# Patient Record
Sex: Male | Born: 1985 | Race: White | Hispanic: No | Marital: Married | Smoking: Current every day smoker
Health system: Southern US, Community
[De-identification: ages and names within clinical notes are randomized; demographics above are authoritative.]

## PROBLEM LIST (undated history)

## (undated) DIAGNOSIS — IMO0002 Reserved for concepts with insufficient information to code with codable children: Secondary | ICD-10-CM

## (undated) DIAGNOSIS — M109 Gout, unspecified: Secondary | ICD-10-CM

## (undated) DIAGNOSIS — F3110 Bipolar disorder, current episode manic without psychotic features, unspecified: Secondary | ICD-10-CM

## (undated) HISTORY — PX: VASECTOMY: SHX75

---

## 2013-10-04 ENCOUNTER — Encounter (HOSPITAL_COMMUNITY): Payer: Self-pay | Admitting: Emergency Medicine

## 2013-10-04 ENCOUNTER — Emergency Department (INDEPENDENT_AMBULATORY_CARE_PROVIDER_SITE_OTHER)
Admission: EM | Admit: 2013-10-04 | Discharge: 2013-10-04 | Disposition: A | Discharge status: Home / Self Care | Payer: Self-pay | Source: Home / Self Care

## 2013-10-04 DIAGNOSIS — A084 Viral intestinal infection, unspecified: Secondary | ICD-10-CM

## 2013-10-04 DIAGNOSIS — R6889 Other general symptoms and signs: Secondary | ICD-10-CM

## 2013-10-04 DIAGNOSIS — A088 Other specified intestinal infections: Secondary | ICD-10-CM

## 2013-10-04 DIAGNOSIS — J029 Acute pharyngitis, unspecified: Secondary | ICD-10-CM

## 2013-10-04 LAB — POCT RAPID STREP A: Streptococcus, Group A Screen (Direct): NEGATIVE

## 2013-10-04 MED ORDER — HYDROCOD POLST-CHLORPHEN POLST 10-8 MG/5ML PO LQCR: ORAL | Status: DC

## 2013-10-04 MED ORDER — CLINDAMYCIN HCL 300 MG PO CAPS: 300.0000 mg | ORAL_CAPSULE | Freq: Three times a day (TID) | ORAL | Status: DC

## 2013-10-04 MED ORDER — ONDANSETRON HCL 4 MG PO TABS: 4.0000 mg | ORAL_TABLET | Freq: Four times a day (QID) | ORAL | Status: DC

## 2013-10-04 NOTE — Discharge Instructions (Signed)
Viral Gastroenteritis °Viral gastroenteritis is also known as stomach flu. This condition affects the stomach and intestinal tract. It can cause sudden diarrhea and vomiting. The illness typically lasts 3 to 8 days. Most people develop an immune response that eventually gets rid of the virus. While this natural response develops, the virus can make you quite ill. °CAUSES  °Many different viruses can cause gastroenteritis, such as rotavirus or noroviruses. You can catch one of these viruses by consuming contaminated food or water. You may also catch a virus by sharing utensils or other personal items with an infected person or by touching a contaminated surface. °SYMPTOMS  °The most common symptoms are diarrhea and vomiting. These problems can cause a severe loss of body fluids (dehydration) and a body salt (electrolyte) imbalance. Other symptoms may include: °· Fever. °· Headache. °· Fatigue. °· Abdominal pain. °DIAGNOSIS  °Your caregiver can usually diagnose viral gastroenteritis based on your symptoms and a physical exam. A stool sample may also be taken to test for the presence of viruses or other infections. °TREATMENT  °This illness typically goes away on its own. Treatments are aimed at rehydration. The most serious cases of viral gastroenteritis involve vomiting so severely that you are not able to keep fluids down. In these cases, fluids must be given through an intravenous line (IV). °HOME CARE INSTRUCTIONS  °· Drink enough fluids to keep your urine clear or pale yellow. Drink small amounts of fluids frequently and increase the amounts as tolerated. °· Ask your caregiver for specific rehydration instructions. °· Avoid: °· Foods high in sugar. °· Alcohol. °· Carbonated drinks. °· Tobacco. °· Juice. °· Caffeine drinks. °· Extremely hot or cold fluids. °· Fatty, greasy foods. °· Too much intake of anything at one time. °· Dairy products until 24 to 48 hours after diarrhea stops. °· You may consume probiotics.  Probiotics are active cultures of beneficial bacteria. They may lessen the amount and number of diarrheal stools in adults. Probiotics can be found in yogurt with active cultures and in supplements. °· Wash your hands well to avoid spreading the virus. °· Only take over-the-counter or prescription medicines for pain, discomfort, or fever as directed by your caregiver. Do not give aspirin to children. Antidiarrheal medicines are not recommended. °· Ask your caregiver if you should continue to take your regular prescribed and over-the-counter medicines. °· Keep all follow-up appointments as directed by your caregiver. °SEEK IMMEDIATE MEDICAL CARE IF:  °· You are unable to keep fluids down. °· You do not urinate at least once every 6 to 8 hours. °· You develop shortness of breath. °· You notice blood in your stool or vomit. This may look like coffee grounds. °· You have abdominal pain that increases or is concentrated in one small area (localized). °· You have persistent vomiting or diarrhea. °· You have a fever. °· The patient is a child younger than 3 months, and he or she has a fever. °· The patient is a child older than 3 months, and he or she has a fever and persistent symptoms. °· The patient is a child older than 3 months, and he or she has a fever and symptoms suddenly get worse. °· The patient is a baby, and he or she has no tears when crying. °MAKE SURE YOU:  °· Understand these instructions. °· Will watch your condition. °· Will get help right away if you are not doing well or get worse. °Document Released: 07/15/2005 Document Revised: 10/07/2011 Document Reviewed: 05/01/2011 °  [employer] Patient Information 2014 [place], [place].  Sore Throat A sore throat is pain, burning, irritation, or scratchiness of the throat. There is often pain or tenderness when swallowing or talking. A sore throat may be accompanied by other symptoms, such as coughing, sneezing, fever, and swollen neck glands. A sore throat is  often the first sign of another sickness, such as a cold, flu, strep throat, or mononucleosis (commonly known as mono). Most sore throats go away without medical treatment. CAUSES  The most common causes of a sore throat include:  A viral infection, such as a cold, flu, or mono.  A bacterial infection, such as strep throat, tonsillitis, or whooping cough.  Seasonal allergies.  Dryness in the air.  Irritants, such as smoke or pollution.  Gastroesophageal reflux disease (GERD). HOME CARE INSTRUCTIONS   Only take over-the-counter medicines as directed by your caregiver.  Drink enough fluids to keep your urine clear or pale yellow.  Rest as needed.  Try using throat sprays, lozenges, or sucking on hard candy to ease any pain (if older than 4 years or as directed).  Sip warm liquids, such as broth, herbal tea, or warm water with honey to relieve pain temporarily. You may also eat or drink cold or frozen liquids such as frozen ice pops.  Gargle with salt water (mix 1 tsp salt with 8 oz of water).  Do not smoke and avoid secondhand smoke.  Put a cool-mist humidifier in your bedroom at night to moisten the air. You can also turn on a hot shower and sit in the bathroom with the door closed for 5 10 minutes. SEEK IMMEDIATE MEDICAL CARE IF:  You have difficulty breathing.  You are unable to swallow fluids, soft foods, or your saliva.  You have increased swelling in the throat.  Your sore throat does not get better in 7 days.  You have nausea and vomiting.  You have a fever or persistent symptoms for more than 2 3 days.  You have a fever and your symptoms suddenly get worse. MAKE SURE YOU:   Understand these instructions.  Will watch your condition.  Will get help right away if you are not doing well or get worse. Document Released: 08/22/2004 Document Revised: 07/01/2012 Document Reviewed: 03/22/2012 [facility] Patient Information 2014 [place],  [place].  Pharyngitis Pharyngitis is redness, pain, and swelling (inflammation) of your pharynx.  CAUSES  Pharyngitis is usually caused by infection. Most of the time, these infections are from viruses (viral) and are part of a cold. However, sometimes pharyngitis is caused by bacteria (bacterial). Pharyngitis can also be caused by allergies. Viral pharyngitis may be spread from person to person by coughing, sneezing, and personal items or utensils (cups, forks, spoons, toothbrushes). Bacterial pharyngitis may be spread from person to person by more intimate contact, such as kissing.  SIGNS AND SYMPTOMS  Symptoms of pharyngitis include:   Sore throat.   Tiredness (fatigue).   Low-grade fever.   Headache.  Joint pain and muscle aches.  Skin rashes.  Swollen lymph nodes.  Plaque-like film on throat or tonsils (often seen with bacterial pharyngitis). DIAGNOSIS  Your health care provider will ask you questions about your illness and your symptoms. Your medical history, along with a physical exam, is often all that is needed to diagnose pharyngitis. Sometimes, a rapid strep test is done. Other lab tests may also be done, depending on the suspected cause.  TREATMENT  Viral pharyngitis will usually get better in 3 4 days without the  use of medicine. Bacterial pharyngitis is treated with medicines that kill germs (antibiotics).  HOME CARE INSTRUCTIONS   Drink enough water and fluids to keep your urine clear or pale yellow.   Only take over-the-counter or prescription medicines as directed by your health care provider:   If you are prescribed antibiotics, make sure you finish them even if you start to feel better.   Do not take aspirin.   Get lots of rest.   Gargle with 8 oz of salt water ( tsp of salt per 1 qt of water) as often as every 1 2 hours to soothe your throat.   Throat lozenges (if you are not at risk for choking) or sprays may be used to soothe your throat. SEEK  MEDICAL CARE IF:   You have large, tender lumps in your neck.  You have a rash.  You cough up green, yellow-brown, or bloody spit. SEEK IMMEDIATE MEDICAL CARE IF:   Your neck becomes stiff.  You drool or are unable to swallow liquids.  You vomit or are unable to keep medicines or liquids down.  You have severe pain that does not go away with the use of recommended medicines.  You have trouble breathing (not caused by a stuffy nose). MAKE SURE YOU:   Understand these instructions.  Will watch your condition.  Will get help right away if you are not doing well or get worse. Document Released: 07/15/2005 Document Revised: 05/05/2013 Document Reviewed: 03/22/2013 [facility] Patient Information 2014 [place], [place].  Influenza, Adult Influenza ("the flu") is a viral infection of the respiratory tract. It occurs more often in winter months because people spend more time in close contact with one another. Influenza can make you feel very sick. Influenza easily spreads from person to person (contagious). CAUSES  Influenza is caused by a virus that infects the respiratory tract. You can catch the virus by breathing in droplets from an infected person's cough or sneeze. You can also catch the virus by touching something that was recently contaminated with the virus and then touching your mouth, nose, or eyes. SYMPTOMS  Symptoms typically last 4 to 10 days and may include:  Fever.  Chills.  Headache, body aches, and muscle aches.  Sore throat.  Chest discomfort and cough.  Poor appetite.  Weakness or feeling tired.  Dizziness.  Nausea or vomiting. DIAGNOSIS  Diagnosis of influenza is often made based on your history and a physical exam. A nose or throat swab test can be done to confirm the diagnosis. RISKS AND COMPLICATIONS You may be at risk for a more severe case of influenza if you smoke cigarettes, have diabetes, have chronic heart disease (such as heart failure) or lung  disease (such as asthma), or if you have a weakened immune system. Elderly people and pregnant women are also at risk for more serious infections. The most common complication of influenza is a lung infection (pneumonia). Sometimes, this complication can require emergency medical care and may be life-threatening. PREVENTION  An annual influenza vaccination (flu shot) is the best way to avoid getting influenza. An annual flu shot is now routinely recommended for all adults in the [place] TREATMENT  In mild cases, influenza goes away on its own. Treatment is directed at relieving symptoms. For more severe cases, your caregiver may prescribe antiviral medicines to shorten the sickness. Antibiotic medicines are not effective, because the infection is caused by a virus, not by bacteria. HOME CARE INSTRUCTIONS  Only take over-the-counter or prescription medicines  for pain, discomfort, or fever as directed by your caregiver.  Use a cool mist humidifier to make breathing easier.  Get plenty of rest until your temperature returns to normal. This usually takes 3 to 4 days.  Drink enough fluids to keep your urine clear or pale yellow.  Cover your mouth and nose when coughing or sneezing, and wash your hands well to avoid spreading the virus.  Stay home from work or school until your fever has been gone for at least 1 full day. SEEK MEDICAL CARE IF:   You have chest pain or a deep cough that worsens or produces more mucus.  You have nausea, vomiting, or diarrhea. SEEK IMMEDIATE MEDICAL CARE IF:   You have difficulty breathing, shortness of breath, or your skin or nails turn bluish.  You have severe neck pain or stiffness.  You have a severe headache, facial pain, or earache.  You have a worsening or recurring fever.  You have nausea or vomiting that cannot be controlled. MAKE SURE YOU:  Understand these instructions.  Will watch your condition.  Will get help right away if you are not doing  well or get worse. Document Released: 07/12/2000 Document Revised: 01/14/2012 Document Reviewed: 10/14/2011 [facility] Patient Information 2014 [place], [place].  Fever, Adult A fever is a temperature of 100.4 F (38 C) or above.  HOME CARE  Take fever medicine as told by your doctor. Do not  take aspirin for fever if you are younger than 30 years of age.  If you are given antibiotic medicine, take it as told. Finish the medicine even if you start to feel better.  Rest.  Drink enough fluids to keep your pee (urine) clear or pale yellow. Do not drink alcohol.  Take a bath or shower with room temperature water. Do not use ice water or alcohol sponge baths.  Wear lightweight, loose clothes. GET HELP RIGHT AWAY IF:   You are short of breath or have trouble breathing.  You are very weak.  You are dizzy or you pass out (faint).  You are very thirsty or are making little or no urine.  You have new pain.  You throw up (vomit) or have watery poop (diarrhea).  You keep throwing up or having watery poop for more than 1 to 2 days.  You have a stiff neck or light bothers your eyes.  You have a skin rash.  You have a fever or problems (symptoms) that last for more than 2 to 3 days.  You have a fever and your problems quickly get worse.  You keep throwing up the fluids you drink.  You do not feel better after 3 days.  You have new problems. MAKE SURE YOU:   Understand these instructions.  Will watch your condition.  Will get help right away if you are not doing well or get worse. Document Released: 04/23/2008 Document Revised: 10/07/2011 Document Reviewed: 05/16/2011 [facility] Patient Information 2014 [place], [place].

## 2013-10-04 NOTE — ED Provider Notes (Signed)
CSN: [id number]     Arrival date & time 10/04/13  7425 History   First MD Initiated Contact with Patient 10/04/13 [phone]     Chief Complaint  Patient presents with  . Influenza   (Consider location/radiation/quality/duration/timing/severity/associated sxs/prior Treatment) HPI Comments: 28 year old male 2 day history of fever, nausea, vomiting, diarrhea, fatigue and malaise, aggressively worsening sore throat dizziness and shaking. He had no diarrhea yesterday or today but did vomit 3 times yesterday. He has had nothing to eat and he can drink fluids but with some throat pain.   History reviewed. No pertinent past medical history. History reviewed. No pertinent past surgical history. History reviewed. No pertinent family history. History  Substance Use Topics  . Smoking status: Current Every Day Smoker -- 0.50 packs/day    Types: Cigarettes  . Smokeless tobacco: Not on file  . Alcohol Use: Yes    Review of Systems  Constitutional: Positive for fever, chills, activity change and fatigue. Negative for diaphoresis.  HENT: Positive for sore throat and trouble swallowing. Negative for ear pain, facial swelling, postnasal drip and rhinorrhea.   Eyes: Negative for pain, discharge and redness.  Respiratory: Negative for cough, chest tightness and shortness of breath.   Cardiovascular: Negative.   Gastrointestinal: Positive for nausea, vomiting, abdominal pain and diarrhea. Negative for constipation.       Off and on right mid quadrant discomfort.  Genitourinary: Negative.   Musculoskeletal: Negative.  Negative for neck pain and neck stiffness.  Skin: Negative for color change and rash.  Neurological: Positive for dizziness. Negative for speech difficulty.  Hematological: Positive for adenopathy.    Allergies  Review of patient's allergies indicates no known allergies.  Home Medications   Current Outpatient Rx  Name  Route  Sig  Dispense  Refill  . chlorpheniramine-HYDROcodone  (TUSSIONEX PENNKINETIC ER) 10-8 MG/5ML LQCR      5ml po q 12h prn congestion and pain   90 mL   0   . clindamycin (CLEOCIN) 300 MG capsule   Oral   Take 1 capsule (300 mg total) by mouth 3 (three) times daily. X 7 days   21 capsule   0   . ondansetron (ZOFRAN) 4 MG tablet   Oral   Take 1 tablet (4 mg total) by mouth every 6 (six) hours.   12 tablet   0    BP 120/72  Pulse 113  Temp(Src) 98.8 F (37.1 C)  Resp 20  SpO2 98% Physical Exam  Nursing note and vitals reviewed. Constitutional: He is oriented to person, place, and time. He appears well-developed and well-nourished. No distress.  HENT:  Bilateral EACs are packed with cerumen obstructing lesion of the TMs. Oropharynx, soft palate and tonsils are beefy red and swollen. There are several white to gray exudates on the tonsils. No trismus. No definite abscesses. Uvula is midline, no tension or bulging to the palatine arch.  Eyes: Conjunctivae and EOM are normal.  Neck: Normal range of motion. Neck supple.  Cardiovascular: Normal rate, regular rhythm and normal heart sounds.   Pulmonary/Chest: Effort normal and breath sounds normal. No respiratory distress. He has no wheezes. He has no rales.  Abdominal: Soft. He exhibits no distension and no mass. There is no tenderness. There is no rebound and no guarding.  Musculoskeletal: Normal range of motion. He exhibits no edema.  Lymphadenopathy:    He has no cervical adenopathy.  Neurological: He is alert and oriented to person, place, and time.  Skin: Skin is warm  and dry. No rash noted.  Psychiatric: He has a normal mood and affect.    ED Course  Procedures (including critical care time) Labs Review Labs Reviewed  POCT RAPID STREP A ([facility] CARE ONLY)   Imaging Review No results found. Results for orders placed during the hospital encounter of 10/04/13  POCT RAPID STREP A ([facility] CARE ONLY)      Result Value Ref Range   Streptococcus, Group A Screen (Direct)  NEGATIVE  NEGATIVE     MDM   1. Exudative pharyngitis   2. Flu-like symptoms   3. Viral gastroenteritis      The strep test is negative. Due to the appearance of the throat and the rapid progression of swelling and pain we will treat with clindamycin. Drink plenty of fluids in stable hydrated Zofran as directed Tussionex 5 meals every 12 hours when necessary pain and congestion dispense 90 cc Rest and diet as discussed.    Hayden Rasmussenavid Dontavius Keim, NP 10/04/13 1018  Hayden Rasmussenavid Mack Alvidrez, NP 10/04/13 1019

## 2013-10-04 NOTE — ED Notes (Signed)
C/o fever.  Chills.  Severe sore throat.  Pain with swallowing.  Dizzy.  Not sleeping well at night.  No relief with otc meds.  Symptoms present since Saturday.

## 2013-10-04 NOTE — ED Provider Notes (Signed)
Medical screening examination/treatment/procedure(s) were performed by non-physician practitioner and as supervising physician I was immediately available for consultation/collaboration.  Leslee Homeavid Helyne Genther, M.D.   Reuben Likesavid C Bing Duffey, MD 10/04/13 [phone]

## 2013-10-06 LAB — CULTURE, GROUP A STREP

## 2016-03-15 ENCOUNTER — Emergency Department (HOSPITAL_COMMUNITY): Payer: Self-pay

## 2016-03-15 ENCOUNTER — Ambulatory Visit (HOSPITAL_COMMUNITY)
Admission: EM | Admit: 2016-03-15 | Discharge: 2016-03-15 | Disposition: A | Discharge status: Nursing Home (Includes ICF) | Payer: Self-pay

## 2016-03-15 ENCOUNTER — Encounter (HOSPITAL_COMMUNITY): Payer: Self-pay | Admitting: *Deleted

## 2016-03-15 ENCOUNTER — Emergency Department (HOSPITAL_COMMUNITY)
Admission: EM | Admit: 2016-03-15 | Discharge: 2016-03-15 | Disposition: A | Discharge status: Home / Self Care | Payer: Self-pay | Attending: Emergency Medicine | Admitting: Emergency Medicine

## 2016-03-15 DIAGNOSIS — F17210 Nicotine dependence, cigarettes, uncomplicated: Secondary | ICD-10-CM | POA: Insufficient documentation

## 2016-03-15 DIAGNOSIS — R4182 Altered mental status, unspecified: Secondary | ICD-10-CM | POA: Insufficient documentation

## 2016-03-15 DIAGNOSIS — Z5321 Procedure and treatment not carried out due to patient leaving prior to being seen by health care provider: Secondary | ICD-10-CM | POA: Insufficient documentation

## 2016-03-15 DIAGNOSIS — R413 Other amnesia: Secondary | ICD-10-CM

## 2016-03-15 HISTORY — DX: Reserved for concepts with insufficient information to code with codable children: IMO0002

## 2016-03-15 HISTORY — DX: Bipolar disorder, current episode manic without psychotic features, unspecified: F31.10

## 2016-03-15 LAB — CBC
HCT: 46.5 % (ref 39.0–52.0)
Hemoglobin: 15.6 g/dL (ref 13.0–17.0)
MCH: 29 pg (ref 26.0–34.0)
MCHC: 33.5 g/dL (ref 30.0–36.0)
MCV: 86.4 fL (ref 78.0–100.0)
PLATELETS: 277 10*3/uL (ref 150–400)
RBC: 5.38 MIL/uL (ref 4.22–5.81)
RDW: 13.7 % (ref 11.5–15.5)
WBC: 10.9 10*3/uL — AB (ref 4.0–10.5)

## 2016-03-15 LAB — COMPREHENSIVE METABOLIC PANEL
ALK PHOS: 177 U/L — AB (ref 38–126)
ALT: 16 U/L — AB (ref 17–63)
AST: 20 U/L (ref 15–41)
Albumin: 3.9 g/dL (ref 3.5–5.0)
Anion gap: 8 (ref 5–15)
BILIRUBIN TOTAL: 1 mg/dL (ref 0.3–1.2)
BUN: 15 mg/dL (ref 6–20)
CALCIUM: 9.7 mg/dL (ref 8.9–10.3)
CO2: 24 mmol/L (ref 22–32)
Chloride: 107 mmol/L (ref 101–111)
Creatinine, Ser: 1.15 mg/dL (ref 0.61–1.24)
Glucose, Bld: 105 mg/dL — ABNORMAL HIGH (ref 65–99)
Potassium: 4.5 mmol/L (ref 3.5–5.1)
Sodium: 139 mmol/L (ref 135–145)
TOTAL PROTEIN: 7.7 g/dL (ref 6.5–8.1)

## 2016-03-15 LAB — CBG MONITORING, ED: GLUCOSE-CAPILLARY: 111 mg/dL — AB (ref 65–99)

## 2016-03-15 NOTE — ED Provider Notes (Signed)
CSN: [id number]     Arrival date & time 03/15/16  1627 History   First MD Initiated Contact with Patient 03/15/16 1726     Chief Complaint  Patient presents with  . Loss of Consciousness   (Consider location/radiation/quality/duration/timing/severity/associated sxs/prior Treatment) 30 year old male presents with a complaint of loss of consciousness and memory today. He states that he woke up this morning feeling well and around noon he called a cab to go to the bank. He members going to the bank and [employer] to check. He ended up going home but does not remember the trip back home. He remembers was that he was in a store. A friend happened to see him and call his name and then he regained consciousness and began to become aware of where he was, again and orientation. He does not remember any of the events that occurred during this early part of the afternoon. He also notes there are several scratches to his arms and neck. He does not recall how are you received them. He has a history of bipolar depression and currently taking cervical which he states has helped him dramatically. He does not list any other significant past medical history. He is currently fully awake, alert and oriented.  He denies any recent head trauma although 2 weeks ago he states he fell on his skateboard and hit his head and believes that he had a concussion at that time.      Past Medical History:  Diagnosis Date  . Bilateral posterior subcapsular polar cataract   . Manic bipolar I disorder [facility](HCC)    Past Surgical History:  Procedure Laterality Date  . VASECTOMY     History reviewed. No pertinent family history. Social History  Substance Use Topics  . Smoking status: Current Every Day Smoker    Packs/day: 0.50    Types: Cigarettes  . Smokeless tobacco: Not on file  . Alcohol use Yes    Review of Systems  Constitutional: Negative.   HENT: Negative.   Respiratory: Negative.   Cardiovascular: Negative.    Gastrointestinal: Negative.   Genitourinary: Negative.   Skin:       As per history of present illness  Neurological: Negative for dizziness, tremors, syncope, facial asymmetry, speech difficulty, weakness, light-headedness, numbness and headaches.  Psychiatric/Behavioral: Negative for agitation and suicidal ideas.       As per history of present illness  All other systems reviewed and are negative.   Allergies  Review of patient's allergies indicates no known allergies.  Home Medications   Prior to Admission medications   Medication Sig Start Date End Date Taking? Authorizing Provider  QUEtiapine (SEROQUEL) 200 MG tablet Take 200 mg by mouth at bedtime.   Yes Historical Provider, MD   Meds Ordered and Administered this Visit  Medications - No data to display  BP 132/78 (BP Location: Right Arm)   Pulse 78   Temp 98.6 F (37 C) (Oral)   Resp 18   SpO2 98%  No data found.   Physical Exam  Constitutional: He is oriented to person, place, and time. He appears well-developed and well-nourished. No distress.  HENT:  Nose: Nose normal.  Mouth/Throat: Oropharynx is clear and moist. No oropharyngeal exudate.  Eyes: Conjunctivae and EOM are normal. Pupils are equal, round, and reactive to light.  Neck: Normal range of motion. Neck supple.  Cardiovascular: Normal rate and intact distal pulses.   Occasional premature beats.  Pulmonary/Chest: Effort normal and breath sounds normal. No respiratory distress.  He has no wheezes. He has no rales.  Musculoskeletal: Normal range of motion. He exhibits no edema, tenderness or deformity.  Lymphadenopathy:    He has no cervical adenopathy.  Neurological: He is alert and oriented to person, place, and time. No cranial nerve deficit. Coordination normal.  Skin: Skin is warm and dry.  Linear abrasions to the neck and upper extremities  Psychiatric: He has a normal mood and affect. His speech is normal and behavior is normal. Thought content  normal. His affect is not angry and not inappropriate. Cognition and memory are impaired. He exhibits abnormal recent memory.  Nursing note and vitals reviewed.   Urgent Care Course   Clinical Course    Procedures (including critical care time)  Labs Review Labs Reviewed - No data to display  Imaging Review No results found.   Visual Acuity Review  Right Eye Distance:   Left Eye Distance:   Bilateral Distance:    Right Eye Near:   Left Eye Near:    Bilateral Near:         MDM   1. Amnesia    Patient is alert, awake and oriented. Speech is clear and goal oriented. He is being transferred to the emergency department due to recent amnesia to events of this afternoon. Uncertain etiology. He did fall with a head injury 2 weeks ago. Medications include Depakote and Seroquel.    Hayden Rasmussenavid Xyla Leisner, NP 03/15/16 [phone]

## 2016-03-15 NOTE — ED Triage Notes (Signed)
No answe x 3

## 2016-03-15 NOTE — ED Notes (Signed)
Repot  Phoned  To  Delories HeinzAshley  robley  Nurse  First

## 2016-03-15 NOTE — ED Triage Notes (Signed)
Pt  Reports  He   Got  In a  Cab  Around noon  Today went  To  The  Bank  To  Cash his  Check  Caught a  Cab  To go home   And  Does  Not  Remember     What  Happened  To  Him over  A  Period  Of time     No  Witnessed   Seizure  Activity       He  Has  Some  scatches  On  His  Neck  And  Arms  He  Is  Awake  And  alert  And  Oriented  At this  Time      He  Reportedly  has  Had  Similar episode  In  Past      But not  The  Same  As  This     He  Is  Sitting  Upright on the  Exam table  He  Is  Speaking in   Complete  sentances  And  Appears  In no  Acute  Distress

## 2016-03-15 NOTE — ED Triage Notes (Signed)
The  Pt is c/o  Being in a taxi and loosing consciousness he woke up 3 hours later in a store  He has a history of the same.  Marland Kitchen.  No pain anywhere

## 2016-08-12 ENCOUNTER — Encounter (HOSPITAL_COMMUNITY): Payer: Self-pay | Admitting: *Deleted

## 2016-08-12 ENCOUNTER — Ambulatory Visit (HOSPITAL_COMMUNITY)
Admission: EM | Admit: 2016-08-12 | Discharge: 2016-08-12 | Disposition: A | Discharge status: Home / Self Care | Payer: Managed Care, Other (non HMO) | Attending: Family Medicine | Admitting: Family Medicine

## 2016-08-12 DIAGNOSIS — M109 Gout, unspecified: Secondary | ICD-10-CM | POA: Insufficient documentation

## 2016-08-12 DIAGNOSIS — M10072 Idiopathic gout, left ankle and foot: Secondary | ICD-10-CM | POA: Diagnosis not present

## 2016-08-12 DIAGNOSIS — M79672 Pain in left foot: Secondary | ICD-10-CM | POA: Diagnosis present

## 2016-08-12 HISTORY — DX: Gout, unspecified: M10.9

## 2016-08-12 LAB — POCT I-STAT, CHEM 8
BUN: 19 mg/dL (ref 6–20)
Calcium, Ion: 1.24 mmol/L (ref 1.15–1.40)
Chloride: 105 mmol/L (ref 101–111)
Creatinine, Ser: 1 mg/dL (ref 0.61–1.24)
Glucose, Bld: 134 mg/dL — ABNORMAL HIGH (ref 65–99)
HEMATOCRIT: 44 % (ref 39.0–52.0)
HEMOGLOBIN: 15 g/dL (ref 13.0–17.0)
Potassium: 4.3 mmol/L (ref 3.5–5.1)
SODIUM: 141 mmol/L (ref 135–145)
TCO2: 25 mmol/L (ref 0–100)

## 2016-08-12 LAB — URIC ACID: URIC ACID, SERUM: 8.1 mg/dL — AB (ref 4.4–7.6)

## 2016-08-12 MED ORDER — COLCHICINE 0.6 MG PO TABS: 0.6000 mg | ORAL_TABLET | Freq: Two times a day (BID) | ORAL | 0 refills | Status: AC

## 2016-08-12 MED ORDER — ALLOPURINOL 100 MG PO TABS: 100.0000 mg | ORAL_TABLET | Freq: Every day | ORAL | 1 refills | Status: AC

## 2016-08-12 MED ORDER — NAPROXEN 500 MG PO TABS: 500.0000 mg | ORAL_TABLET | Freq: Two times a day (BID) | ORAL | 0 refills | Status: AC

## 2016-08-12 NOTE — ED Triage Notes (Signed)
Pain l  Foot  X  2  Days       denys   Any  Injury            Pt  Reports  A  History  Of gout

## 2016-08-12 NOTE — Discharge Instructions (Signed)
See specialist as needed.

## 2016-08-12 NOTE — ED Provider Notes (Signed)
[facility]    CSN: [id number] Arrival date & time: 08/12/16  1416     History   Chief Complaint Chief Complaint  Patient presents with  . Foot Pain    HPI Johnny Caldwell is a 31 y.o. male.   The history is provided by the patient.  Foot Pain  This is a recurrent problem. The current episode started 2 days ago. The problem has been gradually worsening. Associated symptoms comments: No trauma but has h/o gout.. The symptoms are aggravated by walking.    Past Medical History:  Diagnosis Date  . Bilateral posterior subcapsular polar cataract   . Gout   . Manic bipolar I disorder (HCC)     There are no active problems to display for this patient.   Past Surgical History:  Procedure Laterality Date  . VASECTOMY    . VASECTOMY         Home Medications    Prior to Admission medications   Medication Sig Start Date End Date Taking? Authorizing Provider  QUEtiapine (SEROQUEL) 200 MG tablet Take 200 mg by mouth at bedtime.    Historical Provider, MD    Family History History reviewed. No pertinent family history. Father with gout/  Social History Social History  Substance Use Topics  . Smoking status: Current Every Day Smoker    Packs/day: 0.50    Types: Cigarettes  . Smokeless tobacco: Never Used  . Alcohol use Yes     Allergies   Patient has no known allergies.   Review of Systems Review of Systems  Constitutional: Negative.   Musculoskeletal: Positive for gait problem and joint swelling.  All other systems reviewed and are negative.    Physical Exam Triage Vital Signs ED Triage Vitals  Enc Vitals Group     BP 08/12/16 1550 132/90     Pulse Rate 08/12/16 1550 78     Resp 08/12/16 1550 18     Temp 08/12/16 1550 98.6 F (37 C)     Temp Source 08/12/16 1550 Oral     SpO2 08/12/16 1550 100 %     Weight --      Height --      Head Circumference --      Peak Flow --      Pain Score 08/12/16 1554 8     Pain Loc --    Pain Edu? --      Excl. in GC? --    No data found.   Updated Vital Signs BP 132/90 (BP Location: Left Arm)   Pulse 78   Temp 98.6 F (37 C) (Oral)   Resp 18   SpO2 100%   Visual Acuity Right Eye Distance:   Left Eye Distance:   Bilateral Distance:    Right Eye Near:   Left Eye Near:    Bilateral Near:     Physical Exam   UC Treatments / Results  Labs (all labs ordered are listed, but only abnormal results are displayed) Labs Reviewed - No data to display  EKG  EKG Interpretation None       Radiology No results found.  Procedures Procedures (including critical care time)  Medications Ordered in UC Medications - No data to display   Initial Impression / Assessment and Plan / UC Course  I have reviewed the triage vital signs and the nursing notes.  Pertinent labs & imaging results that were available during my care of the patient were reviewed by me and considered in  my medical decision making (see chart for details).  Clinical Course       Final Clinical Impressions(s) / UC Diagnoses   Final diagnoses:  None    New Prescriptions New Prescriptions   No medications on file     Linna Hoff, MD 08/27/16 2149

## 2017-06-27 IMAGING — DX DG CHEST 2V
2 series · 2 of 2 positions shown · non-contrast
Comparison: None.

CLINICAL DATA: Acute onset of loss of consciousness. Initial
encounter.

EXAM:
CHEST  2 VIEW

[w chest pa]
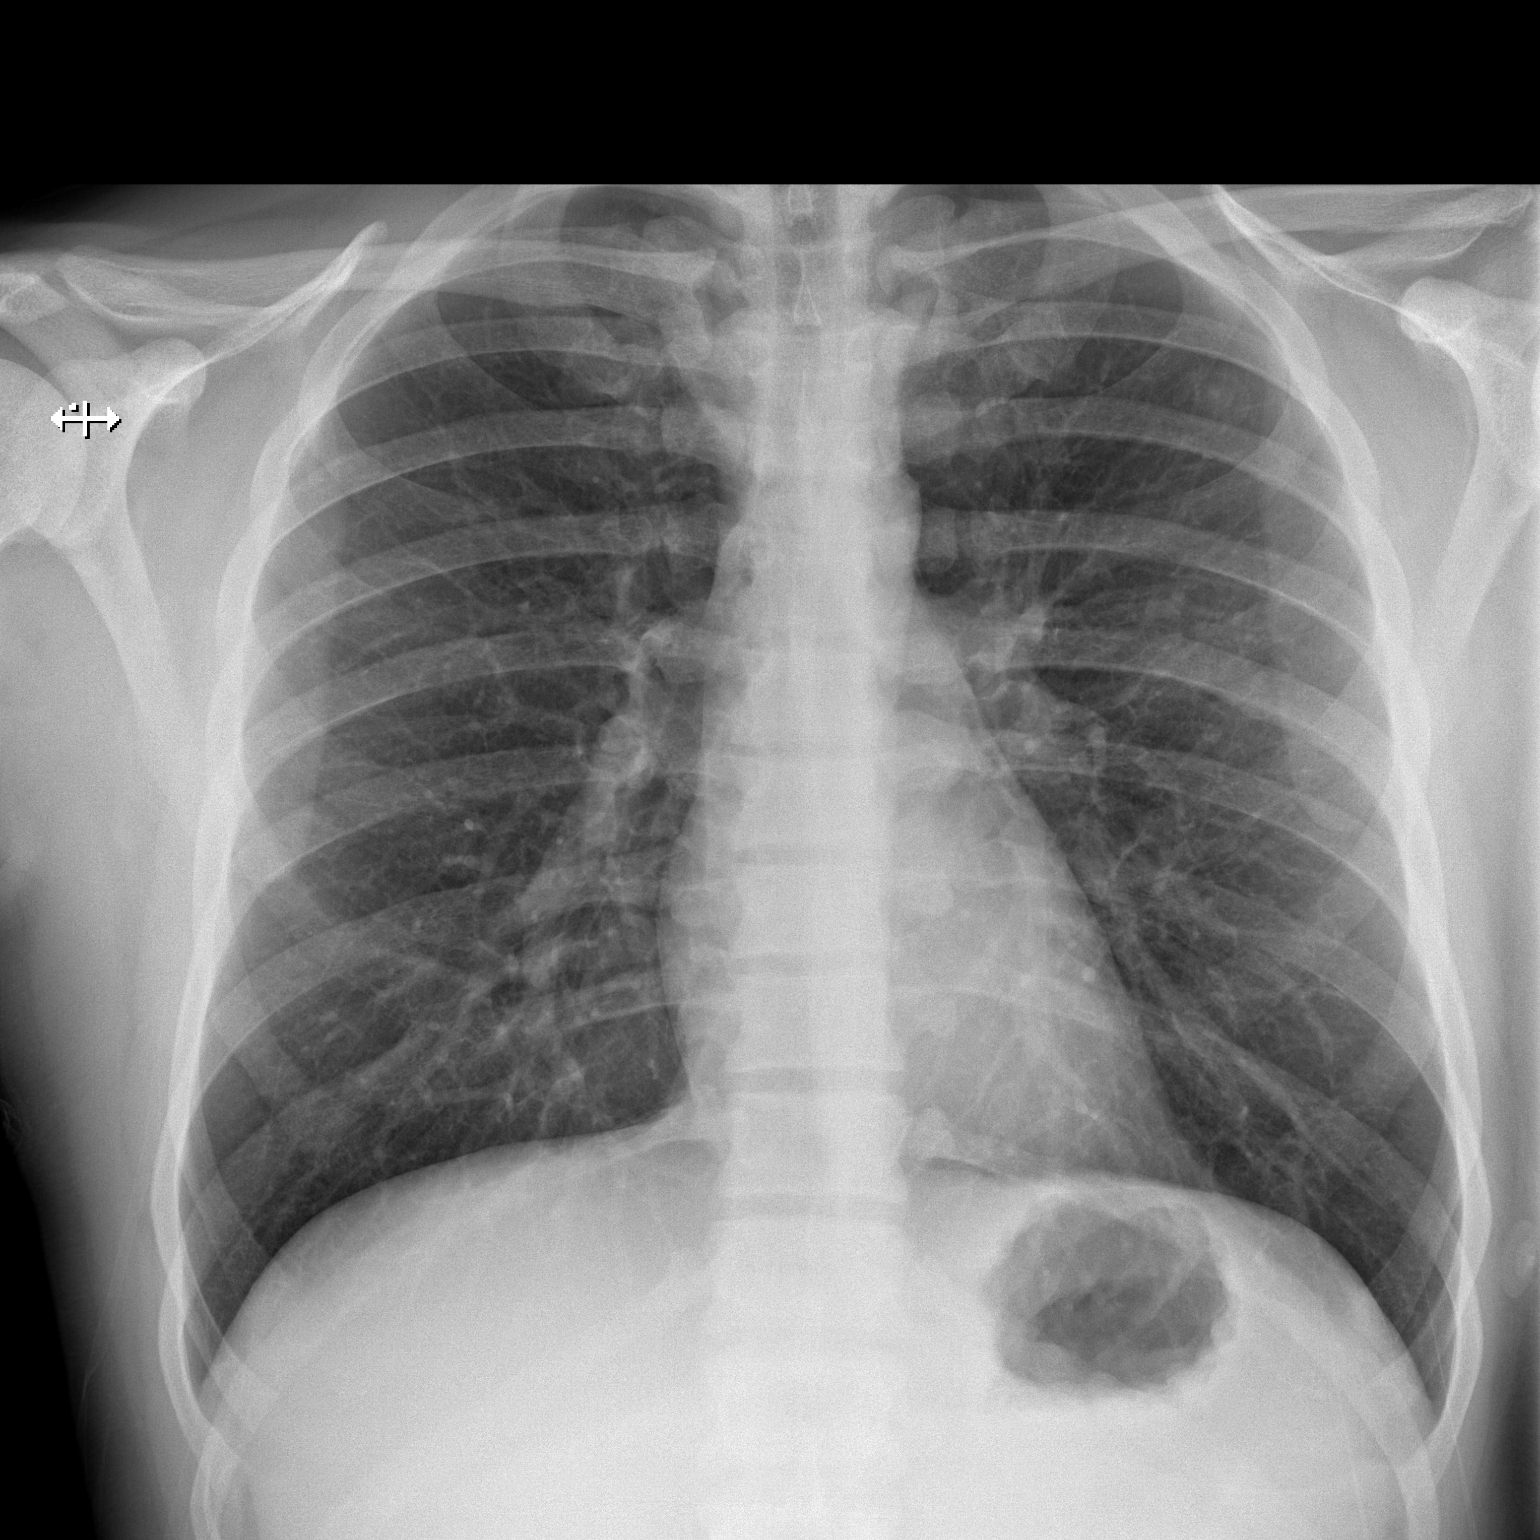

[w chest lat]
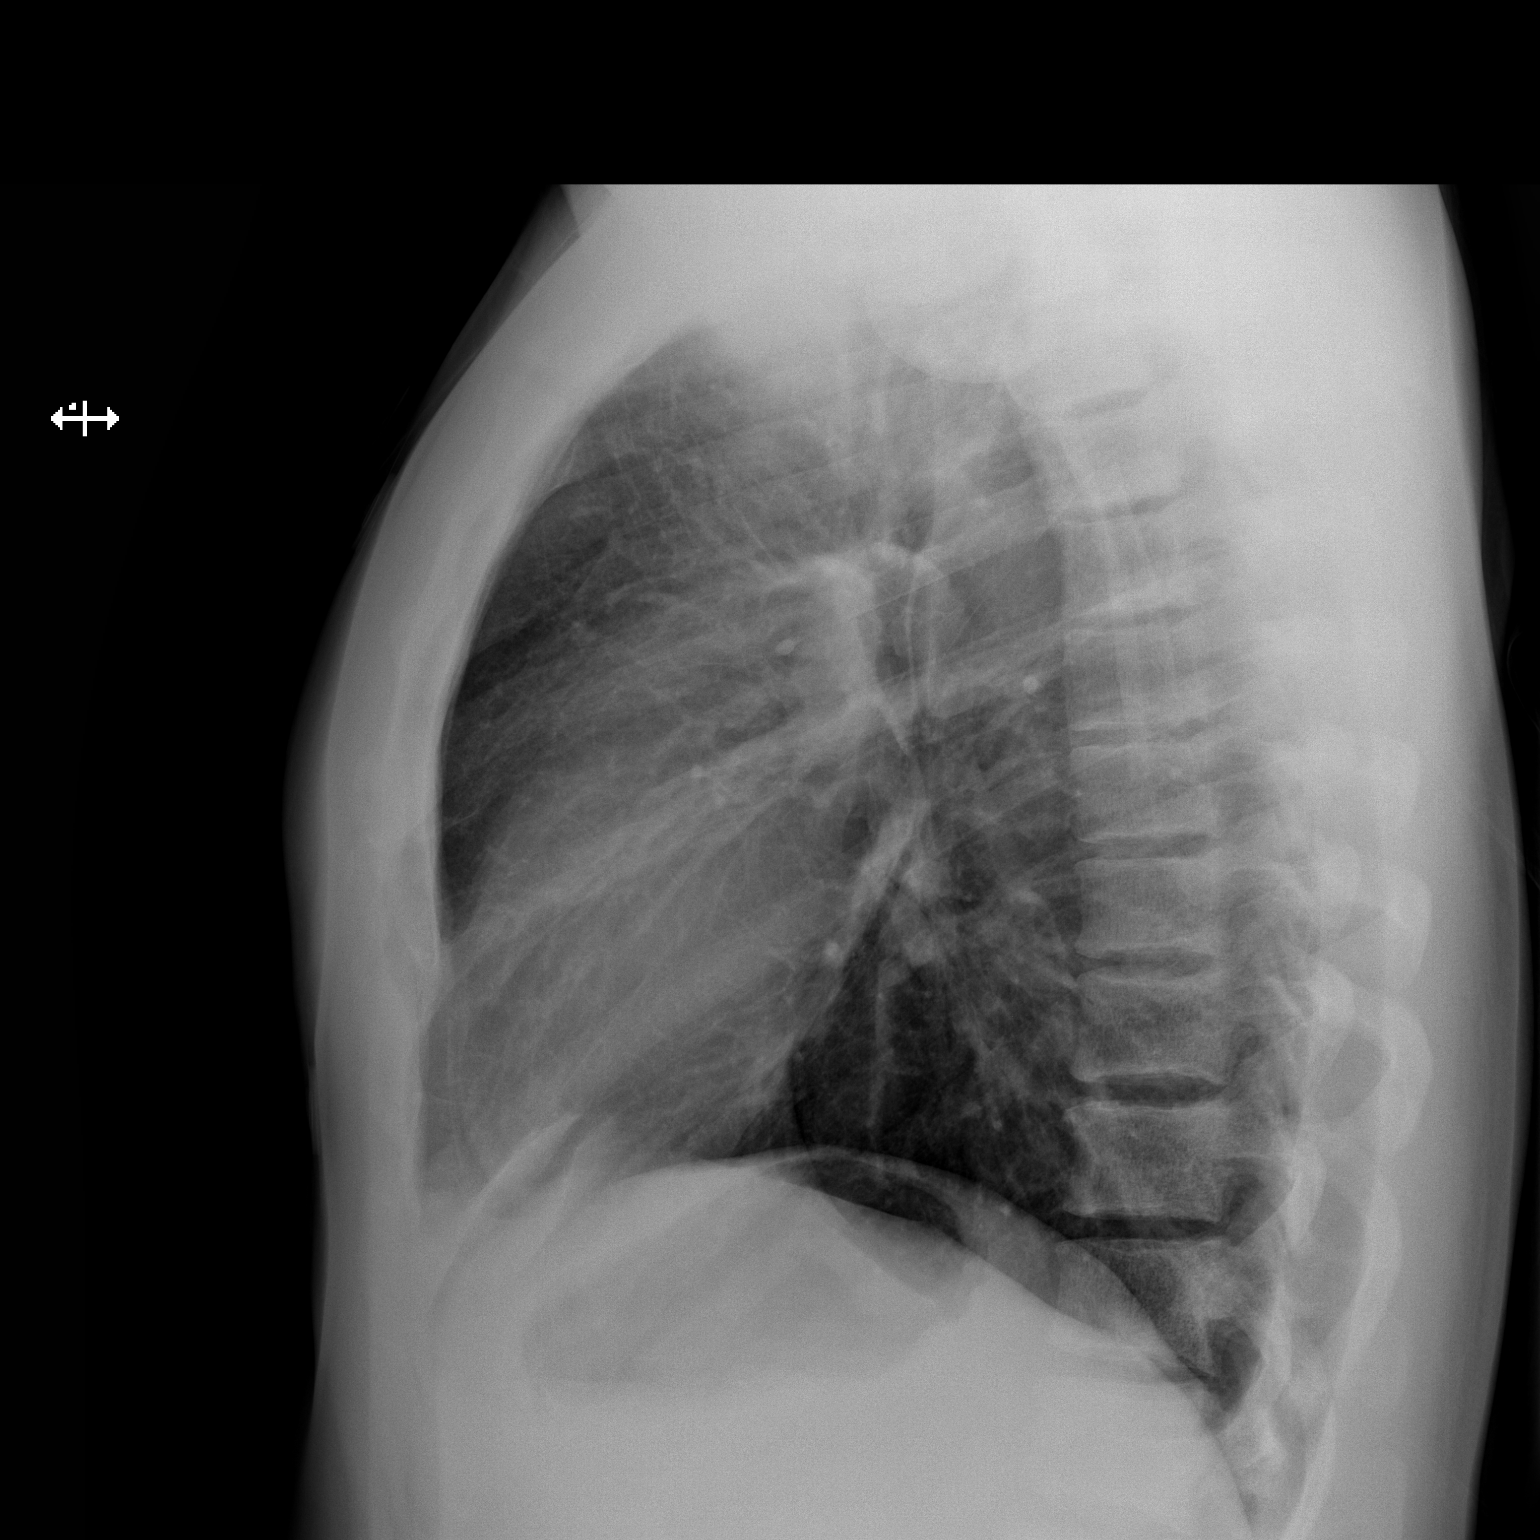

[2 of 2 positions shown; findings below may reference images not displayed]

FINDINGS: The lungs are well-aerated and clear. There is no evidence of focal
opacification, pleural effusion or pneumothorax.

The heart is normal in size; the mediastinal contour is within
normal limits. No acute osseous abnormalities are seen.
IMPRESSION: No acute cardiopulmonary process seen. No displaced rib fractures
identified.

## 2018-05-25 ENCOUNTER — Other Ambulatory Visit: Payer: Self-pay

## 2018-05-25 ENCOUNTER — Other Ambulatory Visit: Payer: Self-pay | Admitting: Physician Assistant

## 2018-05-25 MED ORDER — AMPHETAMINE-DEXTROAMPHETAMINE 20 MG PO TABS: 20.0000 mg | ORAL_TABLET | Freq: Two times a day (BID) | ORAL | 0 refills | Status: DC

## 2018-06-30 ENCOUNTER — Other Ambulatory Visit: Payer: Self-pay

## 2018-07-31 ENCOUNTER — Encounter: Payer: Self-pay | Admitting: Emergency Medicine

## 2018-07-31 DIAGNOSIS — F3132 Bipolar disorder, current episode depressed, moderate: Secondary | ICD-10-CM

## 2018-07-31 DIAGNOSIS — F988 Other specified behavioral and emotional disorders with onset usually occurring in childhood and adolescence: Secondary | ICD-10-CM

## 2018-08-14 ENCOUNTER — Ambulatory Visit: Payer: Self-pay | Admitting: Physician Assistant

## 2018-08-20 ENCOUNTER — Ambulatory Visit: Payer: Self-pay | Admitting: Physician Assistant

## 2018-08-21 ENCOUNTER — Telehealth: Payer: Self-pay | Admitting: Physician Assistant

## 2018-08-21 NOTE — Telephone Encounter (Signed)
Pt called requesting refill on Adderall and Seroquel since apt was CA yesterday

## 2018-08-22 ENCOUNTER — Other Ambulatory Visit: Payer: Self-pay

## 2018-08-22 NOTE — Telephone Encounter (Signed)
Need pharmacy

## 2018-08-24 ENCOUNTER — Telehealth: Payer: Self-pay | Admitting: Physician Assistant

## 2018-08-24 NOTE — Telephone Encounter (Signed)
Tried to reach pt to get pharmacy but no answer and mail box full

## 2018-08-24 NOTE — Telephone Encounter (Signed)
Patient need refill on Adderall and Seraquil to be sent to [facility] on [address], patient has appt. 02/21

## 2018-08-25 MED ORDER — AMPHETAMINE-DEXTROAMPHETAMINE 20 MG PO TABS: 20.0000 mg | ORAL_TABLET | Freq: Two times a day (BID) | ORAL | 0 refills | Status: DC

## 2018-08-25 MED ORDER — QUETIAPINE FUMARATE 300 MG PO TABS: 300.0000 mg | ORAL_TABLET | Freq: Every day | ORAL | 0 refills | Status: DC

## 2018-08-25 NOTE — Telephone Encounter (Signed)
Refill request for Adderall and Seroquel sent to [facility]. Pt had OV 01/27, rescheduled to 09/18/2018

## 2018-08-26 ENCOUNTER — Other Ambulatory Visit: Payer: Self-pay | Admitting: Physician Assistant

## 2018-08-26 MED ORDER — AMPHETAMINE-DEXTROAMPHETAMINE 20 MG PO TABS: 20.0000 mg | ORAL_TABLET | Freq: Two times a day (BID) | ORAL | 0 refills | Status: DC

## 2018-09-17 ENCOUNTER — Other Ambulatory Visit: Payer: Self-pay | Admitting: Physician Assistant

## 2018-09-17 NOTE — Telephone Encounter (Signed)
Has ov tomorrow 02/21

## 2018-09-18 ENCOUNTER — Ambulatory Visit: Payer: Self-pay | Admitting: Physician Assistant

## 2018-09-18 ENCOUNTER — Other Ambulatory Visit: Payer: Self-pay | Admitting: Physician Assistant

## 2018-09-18 ENCOUNTER — Telehealth: Payer: Self-pay | Admitting: Physician Assistant

## 2018-09-18 NOTE — Telephone Encounter (Signed)
Patient requested refill for Seroquil and Adderall. Refill at the [facility] on [address]

## 2018-09-21 ENCOUNTER — Other Ambulatory Visit: Payer: Self-pay

## 2018-09-21 ENCOUNTER — Telehealth: Payer: Self-pay | Admitting: Physician Assistant

## 2018-09-21 ENCOUNTER — Other Ambulatory Visit: Payer: Self-pay | Admitting: Physician Assistant

## 2018-09-21 MED ORDER — AMPHETAMINE-DEXTROAMPHETAMINE 20 MG PO TABS: 20.0000 mg | ORAL_TABLET | Freq: Two times a day (BID) | ORAL | 0 refills | Status: DC

## 2018-09-21 NOTE — Telephone Encounter (Signed)
seroquel submitted to [facility], Adderall submitted to provider for approval

## 2018-09-21 NOTE — Telephone Encounter (Signed)
No it's too early.  The last fill was 08/26/18.  I gave him enough to take as directed.  He shouldn't overtake.

## 2018-09-21 NOTE — Telephone Encounter (Signed)
Pt called stating that the pharmacy won't let him get the Adderall for two more days because its not due yet.  He is out of it and asks if we can approve an early RF.

## 2018-09-22 ENCOUNTER — Telehealth: Payer: Self-pay | Admitting: Physician Assistant

## 2018-09-22 NOTE — Telephone Encounter (Signed)
Pt notified no early refill. Verbalized understanding.

## 2018-09-22 NOTE — Telephone Encounter (Signed)
Traci, we dealt with this yesterday I think.  He can't get it early.  Period.  I gave him enough to last for 30 days and he's not due to pick up till 2/28.

## 2018-09-22 NOTE — Telephone Encounter (Signed)
Patient needed to get script for adderall 20 mg to be filled early and was told by pharmacy that he would need your permission to do this. Patient uses [facility] on [address]

## 2018-09-22 NOTE — Telephone Encounter (Signed)
Pt aware.

## 2018-10-14 ENCOUNTER — Ambulatory Visit: Payer: 59 | Admitting: Physician Assistant

## 2018-10-20 ENCOUNTER — Other Ambulatory Visit: Payer: Self-pay | Admitting: Physician Assistant

## 2018-10-23 ENCOUNTER — Other Ambulatory Visit: Payer: Self-pay

## 2018-10-23 ENCOUNTER — Telehealth: Payer: Self-pay | Admitting: Physician Assistant

## 2018-10-23 MED ORDER — AMPHETAMINE-DEXTROAMPHETAMINE 20 MG PO TABS: 20.0000 mg | ORAL_TABLET | Freq: Two times a day (BID) | ORAL | 0 refills | Status: DC

## 2018-10-23 NOTE — Telephone Encounter (Signed)
Submitted to provider for approval 

## 2018-10-23 NOTE — Telephone Encounter (Signed)
Last fill 09/25/2018 no appts scheduled.

## 2018-10-23 NOTE — Telephone Encounter (Signed)
Pt needs refill on adderall sent to [facility] on [address].

## 2018-11-24 ENCOUNTER — Telehealth: Payer: Self-pay | Admitting: Physician Assistant

## 2018-11-24 ENCOUNTER — Other Ambulatory Visit: Payer: Self-pay

## 2018-11-24 MED ORDER — AMPHETAMINE-DEXTROAMPHETAMINE 20 MG PO TABS: 20.0000 mg | ORAL_TABLET | Freq: Two times a day (BID) | ORAL | 0 refills | Status: DC

## 2018-11-24 NOTE — Telephone Encounter (Signed)
Needs appt but will submit to provider for approval

## 2018-11-24 NOTE — Telephone Encounter (Signed)
Made an appointment for 5/4

## 2018-11-24 NOTE — Telephone Encounter (Signed)
Call him and schedule appt.  Let him know I'm sending this in but if he doesn't make and keep appt, this will be last Rx

## 2018-11-24 NOTE — Telephone Encounter (Signed)
Pt called requesting refill on Vyvanse @ [address]

## 2018-11-25 NOTE — Telephone Encounter (Signed)
Pt has appt 11/30/18

## 2018-11-30 ENCOUNTER — Ambulatory Visit: Payer: 59 | Admitting: Physician Assistant

## 2018-11-30 ENCOUNTER — Other Ambulatory Visit: Payer: Self-pay

## 2018-12-22 ENCOUNTER — Other Ambulatory Visit: Payer: Self-pay | Admitting: Physician Assistant

## 2018-12-22 ENCOUNTER — Telehealth: Payer: Self-pay | Admitting: Physician Assistant

## 2018-12-22 NOTE — Telephone Encounter (Signed)
Patient need refill on Adderall to be sent to [facility] on [address], pt scheduled appt., for 06/17

## 2018-12-22 NOTE — Telephone Encounter (Signed)
Has he cancelled/rescheduled or NS a lot of appts?

## 2018-12-29 ENCOUNTER — Telehealth: Payer: Self-pay | Admitting: Physician Assistant

## 2018-12-29 ENCOUNTER — Other Ambulatory Visit: Payer: Self-pay | Admitting: Physician Assistant

## 2018-12-29 MED ORDER — AMPHETAMINE-DEXTROAMPHETAMINE 20 MG PO TABS: 20.0000 mg | ORAL_TABLET | Freq: Two times a day (BID) | ORAL | 0 refills | Status: DC

## 2018-12-29 NOTE — Telephone Encounter (Signed)
15 day supply sent.  

## 2018-12-29 NOTE — Telephone Encounter (Signed)
Patient requesting partial refill on Adderall until appt., on 06/17 to be sent to [facility] on [address], patient been out of medication since the weekend

## 2019-01-13 ENCOUNTER — Ambulatory Visit: Payer: 59 | Admitting: Physician Assistant

## 2019-01-13 ENCOUNTER — Telehealth: Payer: Self-pay | Admitting: Physician Assistant

## 2019-01-13 ENCOUNTER — Other Ambulatory Visit: Payer: Self-pay

## 2019-01-13 MED ORDER — AMPHETAMINE-DEXTROAMPHETAMINE 20 MG PO TABS: 20.0000 mg | ORAL_TABLET | Freq: Two times a day (BID) | ORAL | 0 refills | Status: DC

## 2019-01-13 NOTE — Telephone Encounter (Signed)
Will pend for Dr. Clovis Pu approval

## 2019-01-13 NOTE — Telephone Encounter (Signed)
Pt needs refill on Adderall 20mg  sent to [facility] on [address].

## 2019-02-10 ENCOUNTER — Encounter: Payer: Self-pay | Admitting: Physician Assistant

## 2019-02-10 ENCOUNTER — Other Ambulatory Visit: Payer: Self-pay

## 2019-02-10 ENCOUNTER — Ambulatory Visit (INDEPENDENT_AMBULATORY_CARE_PROVIDER_SITE_OTHER): Payer: 59 | Admitting: Physician Assistant

## 2019-02-10 DIAGNOSIS — F902 Attention-deficit hyperactivity disorder, combined type: Secondary | ICD-10-CM | POA: Diagnosis not present

## 2019-02-10 DIAGNOSIS — F39 Unspecified mood [affective] disorder: Secondary | ICD-10-CM

## 2019-02-10 DIAGNOSIS — Z79899 Other long term (current) drug therapy: Secondary | ICD-10-CM | POA: Diagnosis not present

## 2019-02-10 MED ORDER — AMPHETAMINE-DEXTROAMPHETAMINE 20 MG PO TABS: 20.0000 mg | ORAL_TABLET | Freq: Two times a day (BID) | ORAL | 0 refills | Status: DC

## 2019-02-10 MED ORDER — QUETIAPINE FUMARATE 300 MG PO TABS: 300.0000 mg | ORAL_TABLET | Freq: Every day | ORAL | 1 refills | Status: DC

## 2019-02-10 NOTE — Progress Notes (Signed)
[facility]  Patient ID: Johnny BalintChristopher Berendt,  MRN: [id number]  PCP: Patient, No Pcp Per  Date of Evaluation: 02/10/2019 Time spent:15 minutes  Chief Complaint:  Chief Complaint    ADHD      HISTORY/CURRENT STATUS: HPI For routine med check.  Despite the circumstances, he's doing well.  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  It has actually been kind of a blessing in disguise because he was able to do a "soft open" and get some kinks worked out before allowing dine in.  He really likes what he does.  States that attention is good without easy distractibility.  Able to focus on things and finish tasks to completion.  Meds are still working well.  Mood is good.  No abnormal fluctuations for no reason. Patient denies loss of interest in usual activities and is able to enjoy things.  Denies decreased energy or motivation.  Appetite has not changed.  No extreme sadness, tearfulness, or feelings of hopelessness.  Denies any changes in concentration, making decisions or remembering things.  Denies suicidal or homicidal thoughts. Patient denies increased energy with decreased need for sleep, no increased talkativeness, no racing thoughts, no impulsivity or risky behaviors, no increased spending, no increased libido, no grandiosity.  Denies dizziness, syncope, seizures, numbness, tingling, tremor, tics, unsteady gait, slurred speech, confusion. Denies muscle or joint pain, stiffness, or dystonia.  Individual Medical History/ Review of Systems: Changes? :No    Past medications for mental health diagnoses include: uncertain  Allergies: Patient has no known allergies.  Current Medications:  Current Outpatient Medications:  .  amphetamine-dextroamphetamine (ADDERALL) 20 MG tablet, Take 1 tablet (20 mg total) by mouth 2 (two) times daily., Disp: 60 tablet, Rfl: 0 .  [START ON 04/11/2019] amphetamine-dextroamphetamine (ADDERALL) 20 MG tablet, Take 1 tablet (20 mg  total) by mouth 2 (two) times daily., Disp: 60 tablet, Rfl: 0 .  [START ON 03/12/2019] amphetamine-dextroamphetamine (ADDERALL) 20 MG tablet, Take 1 tablet (20 mg total) by mouth 2 (two) times daily., Disp: 60 tablet, Rfl: 0 .  amphetamine-dextroamphetamine (ADDERALL) 20 MG tablet, Take 1 tablet (20 mg total) by mouth 2 (two) times daily., Disp: 60 tablet, Rfl: 0 .  naproxen (NAPROSYN) 500 MG tablet, Take 1 tablet (500 mg total) by mouth 2 (two) times daily., Disp: 30 tablet, Rfl: 0 .  QUEtiapine (SEROQUEL) 300 MG tablet, Take 1 tablet (300 mg total) by mouth at bedtime., Disp: 90 tablet, Rfl: 1 .  allopurinol (ZYLOPRIM) 100 MG tablet, Take 1 tablet (100 mg total) by mouth daily. (Patient not taking: Reported on 02/10/2019), Disp: 30 tablet, Rfl: 1 .  colchicine 0.6 MG tablet, Take 1 tablet (0.6 mg total) by mouth 2 (two) times daily. (Patient not taking: Reported on 02/10/2019), Disp: 20 tablet, Rfl: 0 Medication Side Effects: none  Family Medical/ Social History: Changes? No  MENTAL HEALTH EXAM:  There were no vitals taken for this visit.There is no height or weight on file to calculate BMI.  General Appearance: Casual and Obese  Eye Contact:  Good  Speech:  Clear and Coherent  Volume:  Normal  Mood:  Euthymic  Affect:  Appropriate  Thought Process:  Goal Directed  Orientation:  Full (Time, Place, and Person)  Thought Content: Logical   Suicidal Thoughts:  No  Homicidal Thoughts:  No  Memory:  WNL  Judgement:  Good  Insight:  Good  Psychomotor Activity:  Normal  Concentration:  Concentration: Good and Attention Span: Good  Recall:  Good  Fund of Knowledge: Good  Language: Good  Assets:  Desire for Improvement  ADL's:  Intact  Cognition: WNL  Prognosis:  Good    DIAGNOSES:    ICD-10-CM   1. Attention deficit hyperactivity disorder (ADHD), combined type  F90.2   2. Encounter for long-term (current) use of medications  E70.350 Basic metabolic panel    Hemoglobin A1c    Lipid  panel  3. Episodic mood disorder (HCC)  F39     Receiving Psychotherapy: No    RECOMMENDATIONS:  Continue Adderall 20 mg twice daily.  PDMP was reviewed. Continue Seroquel 300 mg nightly. Draw labs as above, sometime within the next month. Return in 6 months.  Donnal Moat, PA-C   This record has been created using [employer].  Chart creation errors have been sought, but may not always have been located and corrected. Such creation errors do not reflect on the standard of medical care.

## 2019-05-11 ENCOUNTER — Other Ambulatory Visit: Payer: Self-pay

## 2019-05-11 ENCOUNTER — Telehealth: Payer: Self-pay | Admitting: Physician Assistant

## 2019-05-11 NOTE — Telephone Encounter (Signed)
Pt would like a refill on Adderall 20mg  sent to [facility] on [address].

## 2019-05-11 NOTE — Telephone Encounter (Signed)
Last refill 04/11/2019 Pended for approval for Helene Kelp to submit tomorrow

## 2019-05-12 MED ORDER — AMPHETAMINE-DEXTROAMPHETAMINE 20 MG PO TABS: 20.0000 mg | ORAL_TABLET | Freq: Two times a day (BID) | ORAL | 0 refills | Status: DC

## 2019-08-09 ENCOUNTER — Ambulatory Visit: Payer: 59 | Admitting: Physician Assistant

## 2019-08-12 ENCOUNTER — Telehealth: Payer: Self-pay | Admitting: Physician Assistant

## 2019-08-12 NOTE — Telephone Encounter (Signed)
Pt would like refill on Adderall 20mg . Please send to [facility] on [address].

## 2019-08-13 ENCOUNTER — Other Ambulatory Visit: Payer: Self-pay

## 2019-08-13 MED ORDER — AMPHETAMINE-DEXTROAMPHETAMINE 20 MG PO TABS: 20.0000 mg | ORAL_TABLET | Freq: Two times a day (BID) | ORAL | 0 refills | Status: DC

## 2019-08-13 NOTE — Telephone Encounter (Signed)
Last refill 07/12/2019 Pended for approval

## 2019-08-13 NOTE — Telephone Encounter (Signed)
Pt called requesting refill for Adderall @ [facility] on [facility]. Next appt 2/15

## 2019-09-13 ENCOUNTER — Encounter: Payer: Self-pay | Admitting: Physician Assistant

## 2019-09-13 ENCOUNTER — Other Ambulatory Visit: Payer: Self-pay

## 2019-09-13 ENCOUNTER — Ambulatory Visit (INDEPENDENT_AMBULATORY_CARE_PROVIDER_SITE_OTHER): Payer: BLUE CROSS/BLUE SHIELD | Admitting: Physician Assistant

## 2019-09-13 DIAGNOSIS — F39 Unspecified mood [affective] disorder: Secondary | ICD-10-CM

## 2019-09-13 DIAGNOSIS — F902 Attention-deficit hyperactivity disorder, combined type: Secondary | ICD-10-CM | POA: Diagnosis not present

## 2019-09-13 DIAGNOSIS — Z79899 Other long term (current) drug therapy: Secondary | ICD-10-CM | POA: Diagnosis not present

## 2019-09-13 MED ORDER — AMPHETAMINE-DEXTROAMPHETAMINE 20 MG PO TABS: 20.0000 mg | ORAL_TABLET | Freq: Two times a day (BID) | ORAL | 0 refills | Status: DC

## 2019-09-13 MED ORDER — QUETIAPINE FUMARATE 300 MG PO TABS: 300.0000 mg | ORAL_TABLET | Freq: Every day | ORAL | 1 refills | Status: DC

## 2019-09-13 NOTE — Progress Notes (Signed)
[facility]  Patient ID: Johnny Caldwell,  MRN: [id number]  PCP: Patient, No Pcp Per  Date of Evaluation: 09/13/2019 Time spent:20 minutes  Chief Complaint:  Chief Complaint    Medication Refill; ADHD      HISTORY/CURRENT STATUS: HPI For routine med check.  Doing really well.  Feels that meds are working well.   Patient denies loss of interest in usual activities and is able to enjoy things.  Denies decreased energy or motivation.  Appetite has not changed.  No extreme sadness, tearfulness, or feelings of hopelessness.  Denies any changes in concentration, making decisions or remembering things.  Denies suicidal or homicidal thoughts.  Patient denies increased energy with decreased need for sleep, no increased talkativeness, no racing thoughts, no impulsivity or risky behaviors, no increased spending, no increased libido, no grandiosity.  States that attention is good without easy distractibility.  Able to focus on things and finish tasks to completion.   Denies dizziness, syncope, seizures, numbness, tingling, tremor, tics, unsteady gait, slurred speech, confusion. Denies muscle or joint pain, stiffness, or dystonia.  Individual Medical History/ Review of Systems: Changes? 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  He is working around 60 hours a week some of that time is pro bono, but he is doing well work-wise.  Past medications for mental health diagnoses include: Adderall, Seroquel, Klonopin, Zoloft  Allergies: Patient has no known allergies.  Current Medications:  Current Outpatient Medications:  .  amphetamine-dextroamphetamine (ADDERALL) 20 MG tablet, Take 1 tablet (20 mg total) by mouth 2 (two) times daily., Disp: 60 tablet, Rfl: 0 .  [START ON 11/09/2019] amphetamine-dextroamphetamine (ADDERALL) 20 MG tablet, Take 1 tablet (20 mg total) by mouth 2 (two) times daily., Disp: 60 tablet, Rfl: 0 .   [START ON 10/10/2019] amphetamine-dextroamphetamine (ADDERALL) 20 MG tablet, Take 1 tablet (20 mg total) by mouth 2 (two) times daily., Disp: 60 tablet, Rfl: 0 .  amphetamine-dextroamphetamine (ADDERALL) 20 MG tablet, Take 1 tablet (20 mg total) by mouth 2 (two) times daily., Disp: 60 tablet, Rfl: 0 .  QUEtiapine (SEROQUEL) 300 MG tablet, Take 1 tablet (300 mg total) by mouth at bedtime., Disp: 90 tablet, Rfl: 1 .  allopurinol (ZYLOPRIM) 100 MG tablet, Take 1 tablet (100 mg total) by mouth daily. (Patient not taking: Reported on 02/10/2019), Disp: 30 tablet, Rfl: 1 .  colchicine 0.6 MG tablet, Take 1 tablet (0.6 mg total) by mouth 2 (two) times daily. (Patient not taking: Reported on 02/10/2019), Disp: 20 tablet, Rfl: 0 .  naproxen (NAPROSYN) 500 MG tablet, Take 1 tablet (500 mg total) by mouth 2 (two) times daily. (Patient not taking: Reported on 09/13/2019), Disp: 30 tablet, Rfl: 0 Medication Side Effects: none  Family Medical/ Social History: Changes? No  MENTAL HEALTH EXAM:  There were no vitals taken for this visit.There is no height or weight on file to calculate BMI.  General Appearance: Casual, Neat, Well Groomed and Obese  Eye Contact:  Good  Speech:  Clear and Coherent  Volume:  Normal  Mood:  Euthymic  Affect:  Appropriate  Thought Process:  Goal Directed and Descriptions of Associations: Intact  Orientation:  Full (Time, Place, and Person)  Thought Content: Logical   Suicidal Thoughts:  No  Homicidal Thoughts:  No  Memory:  WNL  Judgement:  Good  Insight:  Good  Psychomotor Activity:  Normal  Concentration:  Concentration: Good and Attention Span: Good  Recall:  Good  Fund of Knowledge: Good  Language: Good  Assets:  Desire for Improvement  ADL's:  Intact  Cognition: WNL  Prognosis:  Good    DIAGNOSES:    ICD-10-CM   1. Episodic mood disorder (HCC)  F39   2. Attention deficit hyperactivity disorder (ADHD), combined type  F90.2   3. Encounter for long-term (current)  use of medications  Z79.899 Lipid panel    Basic metabolic panel    Receiving Psychotherapy: No    RECOMMENDATIONS:  PDMP was reviewed. I spent 20 minutes with him. Continue Adderall 20 mg 1 p.o. twice daily. Continue Seroquel 300 mg 1 nightly. Labs ordered as above.  Importance stressed. Return in 6 months.  Melony Overly, PA-C

## 2019-12-09 ENCOUNTER — Telehealth: Payer: Self-pay | Admitting: Physician Assistant

## 2019-12-09 NOTE — Telephone Encounter (Signed)
Pt would like a refill on Adderall. Please send to [facility] on [address]

## 2019-12-10 ENCOUNTER — Other Ambulatory Visit: Payer: Self-pay

## 2019-12-10 MED ORDER — AMPHETAMINE-DEXTROAMPHETAMINE 20 MG PO TABS: 20.0000 mg | ORAL_TABLET | Freq: Two times a day (BID) | ORAL | 0 refills | Status: DC

## 2019-12-10 NOTE — Telephone Encounter (Signed)
Last refill 11/10/2019 pended for Dr. Jennelle Human to submit while Rosey Bath out of office. Last visit was 08/2019

## 2020-01-10 ENCOUNTER — Telehealth: Payer: Self-pay | Admitting: Physician Assistant

## 2020-01-10 ENCOUNTER — Other Ambulatory Visit: Payer: Self-pay

## 2020-01-10 MED ORDER — AMPHETAMINE-DEXTROAMPHETAMINE 20 MG PO TABS: 20.0000 mg | ORAL_TABLET | Freq: Two times a day (BID) | ORAL | 0 refills | Status: DC

## 2020-01-10 NOTE — Telephone Encounter (Signed)
Last refill 12/10/2019, pended 3 Rx's until next apt 03/12/2020

## 2020-01-10 NOTE — Telephone Encounter (Signed)
Johnny Caldwell called to request refill of his Adderall.  Made appt 03/13/20.  [facility] on [address]

## 2020-03-13 ENCOUNTER — Encounter (INDEPENDENT_AMBULATORY_CARE_PROVIDER_SITE_OTHER): Payer: Self-pay

## 2020-03-13 ENCOUNTER — Other Ambulatory Visit: Payer: Self-pay

## 2020-03-13 ENCOUNTER — Encounter: Payer: Self-pay | Admitting: Physician Assistant

## 2020-03-13 ENCOUNTER — Ambulatory Visit (INDEPENDENT_AMBULATORY_CARE_PROVIDER_SITE_OTHER): Payer: BLUE CROSS/BLUE SHIELD | Admitting: Physician Assistant

## 2020-03-13 VITALS — BP 150/78 | HR 93

## 2020-03-13 DIAGNOSIS — F39 Unspecified mood [affective] disorder: Secondary | ICD-10-CM | POA: Diagnosis not present

## 2020-03-13 DIAGNOSIS — F902 Attention-deficit hyperactivity disorder, combined type: Secondary | ICD-10-CM | POA: Diagnosis not present

## 2020-03-13 MED ORDER — AMPHETAMINE-DEXTROAMPHETAMINE 20 MG PO TABS: ORAL_TABLET | ORAL | 0 refills | Status: DC

## 2020-03-13 MED ORDER — QUETIAPINE FUMARATE 300 MG PO TABS: 300.0000 mg | ORAL_TABLET | Freq: Every day | ORAL | 1 refills | Status: DC

## 2020-03-13 NOTE — Progress Notes (Signed)
[facility]  Patient ID: Johnny Caldwell,  MRN: [id number]  PCP: Patient, No Pcp Per  Date of Evaluation: 03/13/2020 Time spent:20 minutes  Chief Complaint:  Chief Complaint    ADHD; Memory Loss      HISTORY/CURRENT STATUS: HPI For routine med check.  States that attention is good without easy distractibility. But he does forget things easily, in a very short amount of time. Like even walking from one area to another place in [employer].  Patient denies loss of interest in usual activities and is able to enjoy things.  Denies decreased energy or motivation.  Appetite has not changed.  No extreme sadness, tearfulness, or feelings of hopelessness.  Denies any changes in concentration, making decisions or remembering things.  Denies suicidal or homicidal thoughts.  Patient denies increased energy with decreased need for sleep, no increased talkativeness, no racing thoughts, no impulsivity or risky behaviors, no increased spending, no increased libido,no paranoid, no grandiosity.  Denies dizziness, syncope, seizures, numbness, tingling, tremor, tics, unsteady gait, slurred speech, confusion. Denies muscle or joint pain, stiffness, or dystonia.  Individual Medical History/ Review of Systems: Changes? :Yes  will be seeing Rheumatologist soon for gout.    Past medications for mental health diagnoses include: Adderall, Seroquel, Klonopin, Zoloft  Allergies: Patient has no known allergies.  Current Medications:  Current Outpatient Medications:  .  [START ON 05/11/2020] amphetamine-dextroamphetamine (ADDERALL) 20 MG tablet, 1 po q am, 1 po at lunch, and may take 1/2 in late afternoon prn., Disp: 75 tablet, Rfl: 0 .  [START ON 04/12/2020] amphetamine-dextroamphetamine (ADDERALL) 20 MG tablet, 1 po q am, 1 po at lunch, and may take 1/2 in late afternoon prn., Disp: 75 tablet, Rfl: 0 .  amphetamine-dextroamphetamine (ADDERALL) 20 MG tablet, 1 q am, 1 around lunch, and may  take 1/2 pill in late afternoon, Disp: 75 tablet, Rfl: 0 .  QUEtiapine (SEROQUEL) 300 MG tablet, Take 1 tablet (300 mg total) by mouth at bedtime., Disp: 90 tablet, Rfl: 1 .  allopurinol (ZYLOPRIM) 100 MG tablet, Take 1 tablet (100 mg total) by mouth daily. (Patient not taking: Reported on 02/10/2019), Disp: 30 tablet, Rfl: 1 .  colchicine 0.6 MG tablet, Take 1 tablet (0.6 mg total) by mouth 2 (two) times daily. (Patient not taking: Reported on 02/10/2019), Disp: 20 tablet, Rfl: 0 .  naproxen (NAPROSYN) 500 MG tablet, Take 1 tablet (500 mg total) by mouth 2 (two) times daily. (Patient not taking: Reported on 09/13/2019), Disp: 30 tablet, Rfl: 0 Medication Side Effects: none  Family Medical/ Social History: Changes? No  MENTAL HEALTH EXAM:  Blood pressure (!) 150/78, pulse 93.There is no height or weight on file to calculate BMI.  General Appearance: Casual, Neat, Well Groomed and Obese  Eye Contact:  Good  Speech:  Clear and Coherent and Normal Rate  Volume:  Normal  Mood:  Euthymic  Affect:  Appropriate  Thought Process:  Goal Directed and Descriptions of Associations: Intact  Orientation:  Full (Time, Place, and Person)  Thought Content: Logical   Suicidal Thoughts:  No  Homicidal Thoughts:  No  Memory:  WNL  Judgement:  Good  Insight:  Good  Psychomotor Activity:  Normal  Concentration:  Concentration: Good and Attention Span: Good  Recall:  Good  Fund of Knowledge: Good  Language: Good  Assets:  Desire for Improvement  ADL's:  Intact  Cognition: WNL  Prognosis:  Good    DIAGNOSES:    ICD-10-CM   1. Attention deficit hyperactivity  disorder (ADHD), combined type  F90.2   2. Episodic mood disorder (HCC)  F39     Receiving Psychotherapy: No    RECOMMENDATIONS:  PDMP was reviewed. I provided 20 minutes of face-to-face time during this encounter. Increase Adderall 20 mg to 1 p.o. every morning, 1 p.o. at lunch, and may take half in the late afternoon as needed. Continue  Seroquel 300 mg 1 nightly. He will have labs drawn soon. Return in 6 months.  Melony Overly, PA-C

## 2020-06-15 ENCOUNTER — Telehealth: Payer: Self-pay | Admitting: Physician Assistant

## 2020-06-15 NOTE — Telephone Encounter (Signed)
Pt would like a refill on Adderall. Please send to [facility] on [address]

## 2020-06-16 ENCOUNTER — Other Ambulatory Visit: Payer: Self-pay

## 2020-06-16 MED ORDER — AMPHETAMINE-DEXTROAMPHETAMINE 20 MG PO TABS: ORAL_TABLET | ORAL | 0 refills | Status: DC

## 2020-06-16 NOTE — Telephone Encounter (Signed)
Last refill 05/17/20 Next apt 06/29/20 Pended 1 Rx for Rosey Bath to review and send

## 2020-06-29 ENCOUNTER — Ambulatory Visit: Payer: BLUE CROSS/BLUE SHIELD | Admitting: Physician Assistant

## 2020-07-27 ENCOUNTER — Ambulatory Visit: Payer: BLUE CROSS/BLUE SHIELD | Admitting: Physician Assistant

## 2020-08-11 ENCOUNTER — Other Ambulatory Visit: Payer: Self-pay | Admitting: Physician Assistant

## 2020-08-11 ENCOUNTER — Telehealth: Payer: Self-pay | Admitting: Physician Assistant

## 2020-08-11 MED ORDER — AMPHETAMINE-DEXTROAMPHETAMINE 20 MG PO TABS: ORAL_TABLET | ORAL | 0 refills | Status: DC

## 2020-08-11 NOTE — Telephone Encounter (Signed)
Pt would like a refill for Adderall 20mg . Please send to [facility] on [address].

## 2020-08-11 NOTE — Telephone Encounter (Signed)
Prescription was sent

## 2020-09-11 ENCOUNTER — Telehealth: Payer: Self-pay | Admitting: Physician Assistant

## 2020-09-11 ENCOUNTER — Other Ambulatory Visit: Payer: Self-pay | Admitting: Physician Assistant

## 2020-09-11 MED ORDER — AMPHETAMINE-DEXTROAMPHETAMINE 20 MG PO TABS: ORAL_TABLET | ORAL | 0 refills | Status: DC

## 2020-09-11 NOTE — Telephone Encounter (Signed)
Next appt is 10/03/20. Requesting refill on Adderall 20 mg called to [facility] [phone] and phone number is [phone].

## 2020-09-11 NOTE — Telephone Encounter (Signed)
Prescription was sent

## 2020-10-03 ENCOUNTER — Ambulatory Visit: Payer: BLUE CROSS/BLUE SHIELD | Admitting: Physician Assistant

## 2020-10-10 ENCOUNTER — Other Ambulatory Visit: Payer: Self-pay | Admitting: Physician Assistant

## 2020-10-10 ENCOUNTER — Telehealth: Payer: Self-pay | Admitting: Physician Assistant

## 2020-10-10 MED ORDER — AMPHETAMINE-DEXTROAMPHETAMINE 20 MG PO TABS: ORAL_TABLET | ORAL | 0 refills | Status: DC

## 2020-10-10 NOTE — Telephone Encounter (Signed)
Thanks, Vernona Rieger.  We need to send him a warning letter. If he misses another appt, he will be dismissed. I sent the Rx in.

## 2020-10-10 NOTE — Telephone Encounter (Signed)
Next visit is 12/19/20. Rosey Bath, making you aware that Thayer Ohm has no showed his last two appointments for 10/03/20 and 07/27/20 and cancelled the one for 06/29/20. Requesting a refill for Adderall 20 mg called to [facility] , [address] at [facility] [address] and [address], [phone].

## 2020-10-11 NOTE — Telephone Encounter (Signed)
Letter is printed and in T. Hurst's box for signature.

## 2020-10-11 NOTE — Telephone Encounter (Signed)
Taken care of

## 2020-10-20 ENCOUNTER — Other Ambulatory Visit: Payer: Self-pay | Admitting: Physician Assistant

## 2020-11-09 ENCOUNTER — Telehealth: Payer: Self-pay | Admitting: Physician Assistant

## 2020-11-09 ENCOUNTER — Other Ambulatory Visit: Payer: Self-pay | Admitting: Physician Assistant

## 2020-11-09 MED ORDER — AMPHETAMINE-DEXTROAMPHETAMINE 20 MG PO TABS: ORAL_TABLET | ORAL | 0 refills | Status: DC

## 2020-11-09 NOTE — Telephone Encounter (Signed)
Pt called requesting a refill on his adderall 20 mg to be sent to the [facility] on [address] and [address]. He has an appt 5/24

## 2020-11-09 NOTE — Telephone Encounter (Signed)
Rx was sent  

## 2020-12-08 ENCOUNTER — Other Ambulatory Visit: Payer: Self-pay

## 2020-12-08 ENCOUNTER — Other Ambulatory Visit: Payer: Self-pay | Admitting: Psychiatry

## 2020-12-08 ENCOUNTER — Telehealth: Payer: Self-pay | Admitting: Physician Assistant

## 2020-12-08 MED ORDER — AMPHETAMINE-DEXTROAMPHETAMINE 20 MG PO TABS: ORAL_TABLET | ORAL | 0 refills | Status: DC

## 2020-12-08 NOTE — Telephone Encounter (Signed)
Last filled 4/14 appt 5/24 pended.

## 2020-12-08 NOTE — Telephone Encounter (Signed)
Next visit is 12/19/20. Requesting refill on Adderall 20 mg called to:  [id number] [place], [place] - [address] AT [facility] OF [address] Phone:  [phone]  Fax:  [phone]     Please delete:  [id number] - [place], [place] - [address] AT [address] OF [address] Phone:  [phone]  Fax:  [phone]

## 2020-12-19 ENCOUNTER — Other Ambulatory Visit: Payer: Self-pay

## 2020-12-19 ENCOUNTER — Encounter: Payer: Self-pay | Admitting: Physician Assistant

## 2020-12-19 ENCOUNTER — Ambulatory Visit (INDEPENDENT_AMBULATORY_CARE_PROVIDER_SITE_OTHER): Payer: BLUE CROSS/BLUE SHIELD | Admitting: Physician Assistant

## 2020-12-19 DIAGNOSIS — F902 Attention-deficit hyperactivity disorder, combined type: Secondary | ICD-10-CM | POA: Diagnosis not present

## 2020-12-19 DIAGNOSIS — Z79899 Other long term (current) drug therapy: Secondary | ICD-10-CM

## 2020-12-19 DIAGNOSIS — F39 Unspecified mood [affective] disorder: Secondary | ICD-10-CM

## 2020-12-19 MED ORDER — AMPHETAMINE-DEXTROAMPHETAMINE 20 MG PO TABS: ORAL_TABLET | ORAL | 0 refills | Status: DC

## 2020-12-19 MED ORDER — QUETIAPINE FUMARATE 300 MG PO TABS: ORAL_TABLET | ORAL | 1 refills | Status: DC

## 2020-12-19 NOTE — Progress Notes (Signed)
[facility]  Patient ID: Johnny Caldwell,  MRN: [id number]  PCP: Patient, No Pcp Per (Inactive)  Date of Evaluation: 12/19/2020 Time spent:30 minutes  Chief Complaint:  Chief Complaint    ADHD      HISTORY/CURRENT STATUS: HPI For routine med check.  Doing well.  Able to focus and get things done in a timely manner.  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  Mood is good.  Feels that the Seroquel is working great.  He is able to enjoy things.  Energy and motivation are good.  Not crying easily.  Personal hygiene is normal.  He sleeps well.  Denies suicidal or homicidal thoughts.  Patient denies increased energy with decreased need for sleep, no increased talkativeness, no racing thoughts, no impulsivity or risky behaviors, no increased spending, no increased libido, no grandiosity, no increased irritability or anger, and no hallucinations.  Denies dizziness, syncope, seizures, numbness, tingling, tremor, tics, unsteady gait, slurred speech, confusion. Denies muscle or joint pain, stiffness, or dystonia.  Individual Medical History/ Review of Systems: Changes? :No   Past medications for mental health diagnoses include: Adderall, Seroquel, Klonopin, Zoloft  Allergies: Patient has no known allergies.  Current Medications:  Current Outpatient Medications:  .  allopurinol (ZYLOPRIM) 100 MG tablet, Take 1 tablet (100 mg total) by mouth daily. (Patient not taking: No sig reported), Disp: 30 tablet, Rfl: 1 .  [START ON 03/06/2021] amphetamine-dextroamphetamine (ADDERALL) 20 MG tablet, 1 po q am, 1 po at lunch, and may take 1/2 in late afternoon prn., Disp: 75 tablet, Rfl: 0 .  [START ON 02/04/2021] amphetamine-dextroamphetamine (ADDERALL) 20 MG tablet, 1 q am, 1 around lunch, and may take 1/2 pill in late afternoon, Disp: 75 tablet, Rfl: 0 .  [START ON 01/06/2021] amphetamine-dextroamphetamine (ADDERALL) 20 MG tablet, 1 po q am, 1 po at lunch, and may take 1/2 in late  afternoon prn., Disp: 75 tablet, Rfl: 0 .  colchicine 0.6 MG tablet, Take 1 tablet (0.6 mg total) by mouth 2 (two) times daily. (Patient not taking: No sig reported), Disp: 20 tablet, Rfl: 0 .  naproxen (NAPROSYN) 500 MG tablet, Take 1 tablet (500 mg total) by mouth 2 (two) times daily. (Patient not taking: No sig reported), Disp: 30 tablet, Rfl: 0 .  QUEtiapine (SEROQUEL) 300 MG tablet, TAKE 1 TABLET(300 MG) BY MOUTH AT BEDTIME, Disp: 90 tablet, Rfl: 1 Medication Side Effects: none  Family Medical/ Social History: Changes? No  MENTAL HEALTH EXAM:  There were no vitals taken for this visit.There is no height or weight on file to calculate BMI.  General Appearance: Casual and Well Groomed  Eye Contact:  Good  Speech:  Clear and Coherent and Normal Rate  Volume:  Normal  Mood:  Euthymic  Affect:  Appropriate  Thought Process:  Goal Directed and Descriptions of Associations: Circumstantial  Orientation:  Full (Time, Place, and Person)  Thought Content: Logical   Suicidal Thoughts:  No  Homicidal Thoughts:  No  Memory:  WNL  Judgement:  Good  Insight:  Good  Psychomotor Activity:  Normal  Concentration:  Concentration: Good and Attention Span: Good  Recall:  Good  Fund of Knowledge: Good  Language: Good  Assets:  Desire for Improvement  ADL's:  Intact  Cognition: WNL  Prognosis:  Good    DIAGNOSES:    ICD-10-CM   1. Attention deficit hyperactivity disorder (ADHD), combined type  F90.2   2. Encounter for long-term (current) use of medications  Z79.899 Lipid panel    Comprehensive metabolic panel    Hemoglobin A1c  3. Episodic mood disorder (HCC)  F39     Receiving Psychotherapy: No    RECOMMENDATIONS:  PDMP was reviewed. I provided 30 minutes of face-to-face time during this encounter, including time spent before and after the visit in records review and charting. Reminded him he needs labs.  I have reordered them and importance was stressed.  We need to make sure he  does not have elevated blood sugar or increased cholesterol.  He verbalizes understanding. As far as his medications go he is doing well so no changes will be made. Continue Adderall 20 mg, 1 p.o. every morning, 1 p.o. daily at lunch, one half p.o. in late afternoon as needed. Continue Seroquel 300 mg, 1 p.o. nightly. Labs ordered as above. Return in 6 months.  Melony Overly, PA-C

## 2021-04-18 ENCOUNTER — Telehealth: Payer: Self-pay | Admitting: Physician Assistant

## 2021-04-18 ENCOUNTER — Other Ambulatory Visit: Payer: Self-pay

## 2021-04-18 MED ORDER — AMPHETAMINE-DEXTROAMPHETAMINE 20 MG PO TABS: ORAL_TABLET | ORAL | 0 refills | Status: DC

## 2021-04-18 NOTE — Telephone Encounter (Signed)
Patient called in for refill on Adderall 20mg . Appt 11/21. Ph [phone]. [facility] [address] [address]

## 2021-04-18 NOTE — Telephone Encounter (Signed)
Patient called in for refill on Adderall 20mg . Appt 11/21. Ph: [phone]. Pharmacy [facility]

## 2021-04-18 NOTE — Telephone Encounter (Signed)
Patient called in for refill on Adderall 20mg . Appt 11/21. Ph: [phone]. Pharmacy [facility] [address]

## 2021-04-18 NOTE — Telephone Encounter (Signed)
Pended.

## 2021-05-16 ENCOUNTER — Telehealth: Payer: Self-pay | Admitting: Physician Assistant

## 2021-05-16 ENCOUNTER — Other Ambulatory Visit: Payer: Self-pay

## 2021-05-16 MED ORDER — AMPHETAMINE-DEXTROAMPHETAMINE 20 MG PO TABS: ORAL_TABLET | ORAL | 0 refills | Status: DC

## 2021-05-16 NOTE — Telephone Encounter (Signed)
Pt requesting Rx for Adderall 20 mg to new pharmacy. [facility] [address] A[address] 11/21

## 2021-05-16 NOTE — Telephone Encounter (Signed)
Pended.

## 2021-06-18 ENCOUNTER — Ambulatory Visit (INDEPENDENT_AMBULATORY_CARE_PROVIDER_SITE_OTHER): Payer: 59 | Admitting: Physician Assistant

## 2021-06-18 ENCOUNTER — Other Ambulatory Visit: Payer: Self-pay

## 2021-06-18 ENCOUNTER — Encounter: Payer: Self-pay | Admitting: Physician Assistant

## 2021-06-18 VITALS — BP 132/86 | HR 78

## 2021-06-18 DIAGNOSIS — F902 Attention-deficit hyperactivity disorder, combined type: Secondary | ICD-10-CM | POA: Diagnosis not present

## 2021-06-18 DIAGNOSIS — F39 Unspecified mood [affective] disorder: Secondary | ICD-10-CM | POA: Diagnosis not present

## 2021-06-18 MED ORDER — AMPHETAMINE-DEXTROAMPHETAMINE 20 MG PO TABS: ORAL_TABLET | ORAL | 0 refills | Status: DC

## 2021-06-18 MED ORDER — QUETIAPINE FUMARATE 300 MG PO TABS: ORAL_TABLET | ORAL | 1 refills | Status: DC

## 2021-06-18 NOTE — Progress Notes (Signed)
[facility]  Patient ID: Johnny Caldwell,  MRN: [id number]  PCP: Patient, No Pcp Per (Inactive)  Date of Evaluation: 06/18/2021 Time spent:20 minutes  Chief Complaint:  Chief Complaint   ADHD; Depression; Follow-up      HISTORY/CURRENT STATUS: HPI For routine med check.  The Adderall is still effective.  His insurance does not pay for more than 2 pills/day and he has had to pay out of pocket for the extra 1/2 pill.  For now it is affordable.  States that attention is good without easy distractibility.  Able to focus on things and finish tasks to completion.   Mood is good.  Feels that the Seroquel is working great.  He is able to enjoy things.  Energy and motivation are good.  Not crying easily.  Personal hygiene is normal.  He sleeps well.  Work is going well.  He is a Investment banker, operational.  Denies suicidal or homicidal thoughts.  Patient denies increased energy with decreased need for sleep, no increased talkativeness, no racing thoughts, no impulsivity or risky behaviors, no increased spending, no increased libido, no grandiosity, no increased irritability or anger, and no hallucinations.  Still has not had labs drawn.  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  Denies dizziness, syncope, seizures, numbness, tingling, tremor, tics, unsteady gait, slurred speech, confusion. Denies muscle or joint pain, stiffness, or dystonia. Denies unexplained weight loss, frequent infections, or sores that heal slowly.  No polyphagia, polydipsia, or polyuria. Denies visual changes or paresthesias.   Individual Medical History/ Review of Systems: Changes? :No   Past medications for mental health diagnoses include: Adderall, Seroquel, Klonopin, Zoloft  Allergies: Patient has no known allergies.  Current Medications:  Current Outpatient Medications:    allopurinol (ZYLOPRIM) 100 MG tablet, Take 1  tablet (100 mg total) by mouth daily. (Patient not taking: Reported on 02/10/2019), Disp: 30 tablet, Rfl: 1   [START ON 08/16/2021] amphetamine-dextroamphetamine (ADDERALL) 20 MG tablet, 1 po q am, 1 po at lunch, and may take 1/2 in late afternoon prn., Disp: 75 tablet, Rfl: 0   [START ON 07/17/2021] amphetamine-dextroamphetamine (ADDERALL) 20 MG tablet, 1 po q am, 1 po at lunch, and may take 1/2 in late afternoon prn., Disp: 75 tablet, Rfl: 0   amphetamine-dextroamphetamine (ADDERALL) 20 MG tablet, 1 q am, 1 around lunch, and may take 1/2 pill in late afternoon, Disp: 75 tablet, Rfl: 0   colchicine 0.6 MG tablet, Take 1 tablet (0.6 mg total) by mouth 2 (two) times daily. (Patient not taking: No sig reported), Disp: 20 tablet, Rfl: 0   naproxen (NAPROSYN) 500 MG tablet, Take 1 tablet (500 mg total) by mouth 2 (two) times daily. (Patient not taking: No sig reported), Disp: 30 tablet, Rfl: 0   QUEtiapine (SEROQUEL) 300 MG tablet, TAKE 1 TABLET(300 MG) BY MOUTH AT BEDTIME, Disp: 90 tablet, Rfl: 1 Medication Side Effects: none  Family Medical/ Social History: Changes? No  MENTAL HEALTH EXAM:  Blood pressure 132/86, pulse 78.There is no height or weight on file to calculate BMI.  General Appearance: Casual and Well Groomed  Eye Contact:  Good  Speech:  Clear and Coherent and Normal Rate  Volume:  Normal  Mood:  Euthymic  Affect:  Appropriate  Thought Process:  Goal Directed and Descriptions of Associations: Circumstantial  Orientation:  Full (Time, Place, and Person)  Thought Content: Logical  Suicidal Thoughts:  No  Homicidal Thoughts:  No  Memory:  WNL  Judgement:  Good  Insight:  Good  Psychomotor Activity:  Normal  Concentration:  Concentration: Good and Attention Span: Good  Recall:  Good  Fund of Knowledge: Good  Language: Good  Assets:  Desire for Improvement Housing Transportation  ADL's:  Intact  Cognition: WNL  Prognosis:  Good    DIAGNOSES:    ICD-10-CM   1. Attention  deficit hyperactivity disorder (ADHD), combined type  F90.2     2. Episodic mood disorder (HCC)  F39        Receiving Psychotherapy: No    RECOMMENDATIONS:  PDMP was reviewed.  Last Adderall filled 05/16/2021. I provided 20 minutes of face to face time during this encounter, including time spent before and after the visit in records review, medical decision making, counseling pertinent to today's visit, and charting.  Discussed compliance with labs.  Reminded him that hyperlipidemia and hyperglycemia can occur from Seroquel.  He is not having any symptoms of diabetes but it is very important that he has those labs drawn.  He still has the orders and states he will have them done. Continue Adderall 20 mg, 1 p.o. every morning, 1 p.o. daily at lunch, one half p.o. in late afternoon as needed. Continue Seroquel 300 mg, 1 p.o. nightly. Return in 6 months.  Melony Overly, PA-C

## 2021-08-01 ENCOUNTER — Other Ambulatory Visit: Payer: Self-pay | Admitting: Physician Assistant

## 2021-09-10 ENCOUNTER — Telehealth: Payer: Self-pay | Admitting: Physician Assistant

## 2021-09-10 ENCOUNTER — Other Ambulatory Visit: Payer: Self-pay

## 2021-09-10 MED ORDER — AMPHETAMINE-DEXTROAMPHETAMINE 20 MG PO TABS: ORAL_TABLET | ORAL | 0 refills | Status: DC

## 2021-09-10 NOTE — Telephone Encounter (Signed)
Pended.

## 2021-09-10 NOTE — Telephone Encounter (Addendum)
Pt called requesting Rx for Adderall 20 mg 2.5 tablets  #75 to [facility] in stock. Follow up due 6 mos in May 2023

## 2021-10-25 ENCOUNTER — Other Ambulatory Visit: Payer: Self-pay | Admitting: Physician Assistant

## 2021-10-25 ENCOUNTER — Telehealth: Payer: Self-pay | Admitting: Physician Assistant

## 2021-10-25 MED ORDER — AMPHETAMINE-DEXTROAMPHETAMINE 20 MG PO TABS: ORAL_TABLET | ORAL | 0 refills | Status: DC

## 2021-10-25 NOTE — Telephone Encounter (Signed)
[facility] [place] lm requesting a refill on the Adderall. He uses the [facility] on [address]. Patient was last seen 11/21 with a follow due in May. Nothing scheduled at this time. ?

## 2021-10-25 NOTE — Telephone Encounter (Signed)
Prescription was sent with a note to the pharmacy that patient must be seen for more. ?

## 2021-11-30 ENCOUNTER — Telehealth: Payer: Self-pay | Admitting: Physician Assistant

## 2021-11-30 NOTE — Telephone Encounter (Signed)
Please call to schedule an appt. Last seen 05/2021 with F/U in 6 mo. Due this month.  ?

## 2021-11-30 NOTE — Telephone Encounter (Signed)
Patient called in for refill on Adderall 20mg . Ph: [phone] [address] Pharmacy [facility] [address] ?

## 2021-11-30 NOTE — Telephone Encounter (Signed)
Last filled 3/30

## 2021-12-03 ENCOUNTER — Other Ambulatory Visit: Payer: Self-pay

## 2021-12-03 NOTE — Telephone Encounter (Signed)
Please call to schedule an appt. Last seen 11/22 with RTC in 6 months.   ?

## 2021-12-03 NOTE — Telephone Encounter (Signed)
Pended.

## 2021-12-03 NOTE — Telephone Encounter (Signed)
Pt has an appt 5/31 

## 2021-12-04 MED ORDER — AMPHETAMINE-DEXTROAMPHETAMINE 20 MG PO TABS: ORAL_TABLET | ORAL | 0 refills | Status: DC

## 2021-12-26 ENCOUNTER — Ambulatory Visit: Payer: 59 | Admitting: Physician Assistant

## 2022-01-03 ENCOUNTER — Other Ambulatory Visit: Payer: Self-pay

## 2022-01-03 ENCOUNTER — Telehealth: Payer: Self-pay | Admitting: Physician Assistant

## 2022-01-03 MED ORDER — AMPHETAMINE-DEXTROAMPHETAMINE 20 MG PO TABS: ORAL_TABLET | ORAL | 0 refills | Status: DC

## 2022-01-03 NOTE — Telephone Encounter (Signed)
Pended.

## 2022-01-03 NOTE — Telephone Encounter (Signed)
Patient called in for refill on Adderall 20mg . Ph: [phone] Appt 6/26. Pharmacy [facility] [phone] [address]

## 2022-01-17 ENCOUNTER — Other Ambulatory Visit: Payer: Self-pay | Admitting: Physician Assistant

## 2022-01-18 ENCOUNTER — Other Ambulatory Visit: Payer: Self-pay | Admitting: Physician Assistant

## 2022-01-19 LAB — COMPREHENSIVE METABOLIC PANEL
ALT: 15 IU/L (ref 0–44)
AST: 13 IU/L (ref 0–40)
Albumin/Globulin Ratio: 1.5 (ref 1.2–2.2)
Albumin: 4.3 g/dL (ref 4.0–5.0)
Alkaline Phosphatase: 105 IU/L (ref 44–121)
BUN/Creatinine Ratio: 10 (ref 9–20)
BUN: 12 mg/dL (ref 6–20)
Bilirubin Total: 0.3 mg/dL (ref 0.0–1.2)
CO2: 22 mmol/L (ref 20–29)
Calcium: 9.3 mg/dL (ref 8.7–10.2)
Chloride: 103 mmol/L (ref 96–106)
Creatinine, Ser: 1.22 mg/dL (ref 0.76–1.27)
Globulin, Total: 2.8 g/dL (ref 1.5–4.5)
Glucose: 93 mg/dL (ref 70–99)
Potassium: 5 mmol/L (ref 3.5–5.2)
Sodium: 142 mmol/L (ref 134–144)
Total Protein: 7.1 g/dL (ref 6.0–8.5)
eGFR: 78 mL/min/{1.73_m2} (ref >59)

## 2022-01-19 LAB — LIPID PANEL W/O CHOL/HDL RATIO
Cholesterol, Total: 200 mg/dL — ABNORMAL HIGH (ref 100–199)
HDL: 42 mg/dL (ref >39)
LDL Chol Calc (NIH): 139 mg/dL — ABNORMAL HIGH (ref 0–99)
Triglycerides: 103 mg/dL (ref 0–149)
VLDL Cholesterol Cal: 19 mg/dL (ref 5–40)

## 2022-01-19 LAB — SPECIMEN STATUS REPORT

## 2022-01-19 LAB — HGB A1C W/O EAG: Hgb A1c MFr Bld: 5.3 % (ref 4.8–5.6)

## 2022-01-21 ENCOUNTER — Ambulatory Visit (INDEPENDENT_AMBULATORY_CARE_PROVIDER_SITE_OTHER): Payer: 59 | Admitting: Physician Assistant

## 2022-01-21 ENCOUNTER — Encounter: Payer: Self-pay | Admitting: Physician Assistant

## 2022-01-21 DIAGNOSIS — F39 Unspecified mood [affective] disorder: Secondary | ICD-10-CM | POA: Diagnosis not present

## 2022-01-21 DIAGNOSIS — F902 Attention-deficit hyperactivity disorder, combined type: Secondary | ICD-10-CM

## 2022-01-21 DIAGNOSIS — F17200 Nicotine dependence, unspecified, uncomplicated: Secondary | ICD-10-CM | POA: Diagnosis not present

## 2022-01-21 MED ORDER — AMPHETAMINE-DEXTROAMPHETAMINE 20 MG PO TABS: ORAL_TABLET | ORAL | 0 refills | Status: AC

## 2022-01-21 MED ORDER — AMPHETAMINE-DEXTROAMPHETAMINE 20 MG PO TABS
ORAL_TABLET | ORAL | 0 refills | Status: AC
Start: 2022-03-03 — End: ?

## 2022-01-21 MED ORDER — QUETIAPINE FUMARATE 300 MG PO TABS: 300.0000 mg | ORAL_TABLET | Freq: Every day | ORAL | 3 refills | Status: AC

## 2022-07-15 ENCOUNTER — Ambulatory Visit: Payer: 59 | Admitting: Physician Assistant
